# Patient Record
Sex: Female | Born: 1990 | Race: Black or African American | Hispanic: No | Smoking: Never smoker
Health system: Southern US, Community
[De-identification: ages and names within clinical notes are randomized; demographics above are authoritative.]

## PROBLEM LIST (undated history)

## (undated) ENCOUNTER — Inpatient Hospital Stay (HOSPITAL_COMMUNITY): Payer: Self-pay

## (undated) DIAGNOSIS — K219 Gastro-esophageal reflux disease without esophagitis: Secondary | ICD-10-CM

## (undated) DIAGNOSIS — Z789 Other specified health status: Secondary | ICD-10-CM

## (undated) HISTORY — PX: OTHER SURGICAL HISTORY: SHX169

---
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
## 2010-04-30 ENCOUNTER — Emergency Department (HOSPITAL_BASED_OUTPATIENT_CLINIC_OR_DEPARTMENT_OTHER): Admission: EM | Admit: 2010-04-30 | Discharge: 2010-04-30 | Payer: Self-pay | Admitting: Emergency Medicine

## 2011-02-19 LAB — URINE CULTURE: Colony Count: 50000

## 2011-02-19 LAB — URINALYSIS, ROUTINE W REFLEX MICROSCOPIC
Bilirubin Urine: NEGATIVE
Hgb urine dipstick: NEGATIVE
Ketones, ur: 40 mg/dL — AB
Protein, ur: NEGATIVE mg/dL
Urobilinogen, UA: 1 mg/dL (ref 0.0–1.0)

## 2011-02-19 LAB — BASIC METABOLIC PANEL
CO2: 25 mEq/L (ref 19–32)
Calcium: 9 mg/dL (ref 8.4–10.5)
Glucose, Bld: 81 mg/dL (ref 70–99)
Sodium: 140 mEq/L (ref 135–145)

## 2011-02-19 LAB — DIFFERENTIAL
Basophils Absolute: 0.1 10*3/uL (ref 0.0–0.1)
Basophils Relative: 0 % (ref 0–1)
Eosinophils Relative: 3 % (ref 0–5)
Monocytes Absolute: 1 10*3/uL (ref 0.1–1.0)
Neutro Abs: 10.2 10*3/uL — ABNORMAL HIGH (ref 1.7–7.7)

## 2011-02-19 LAB — CBC
Hemoglobin: 11.4 g/dL — ABNORMAL LOW (ref 12.0–15.0)
MCHC: 33.7 g/dL (ref 30.0–36.0)
RDW: 12.7 % (ref 11.5–15.5)

## 2011-03-20 LAB — URINALYSIS, ROUTINE W REFLEX MICROSCOPIC
Bilirubin Urine: NEGATIVE
Ketones, ur: NEGATIVE mg/dL
Nitrite: NEGATIVE
Urobilinogen, UA: 0.2 mg/dL (ref 0.0–1.0)

## 2014-07-11 ENCOUNTER — Emergency Department (HOSPITAL_BASED_OUTPATIENT_CLINIC_OR_DEPARTMENT_OTHER)
Admission: EM | Admit: 2014-07-11 | Discharge: 2014-07-11 | Disposition: A | Discharge status: Home / Self Care | Payer: Self-pay | Attending: Emergency Medicine | Admitting: Emergency Medicine

## 2014-07-11 ENCOUNTER — Encounter (HOSPITAL_BASED_OUTPATIENT_CLINIC_OR_DEPARTMENT_OTHER): Payer: Self-pay | Admitting: Emergency Medicine

## 2014-07-11 DIAGNOSIS — R51 Headache: Secondary | ICD-10-CM | POA: Insufficient documentation

## 2014-07-11 DIAGNOSIS — R519 Headache, unspecified: Secondary | ICD-10-CM

## 2014-07-11 MED ORDER — DIPHENHYDRAMINE HCL 50 MG/ML IJ SOLN
25.0000 mg | Freq: Once | INTRAMUSCULAR | Status: AC
  Administered 2014-07-11: 25 mg via INTRAVENOUS
  Filled 2014-07-11: qty 1

## 2014-07-11 MED ORDER — KETOROLAC TROMETHAMINE 30 MG/ML IJ SOLN
30.0000 mg | Freq: Once | INTRAMUSCULAR | Status: AC
  Administered 2014-07-11: 30 mg via INTRAVENOUS
  Filled 2014-07-11: qty 1

## 2014-07-11 MED ORDER — SODIUM CHLORIDE 0.9 % IV BOLUS (SEPSIS)
1000.0000 mL | Freq: Once | INTRAVENOUS | Status: AC
  Administered 2014-07-11: 1000 mL via INTRAVENOUS

## 2014-07-11 MED ORDER — METOCLOPRAMIDE HCL 5 MG/ML IJ SOLN
10.0000 mg | Freq: Once | INTRAMUSCULAR | Status: AC
  Administered 2014-07-11: 10 mg via INTRAVENOUS
  Filled 2014-07-11: qty 2

## 2014-07-11 NOTE — Discharge Instructions (Signed)
You are having a headache. No specific cause was found today for your headache. It may have been a migraine or other cause of headache. Stress, anxiety, fatigue, and depression are common triggers for headaches. Your headache today does not appear to be life-threatening or require hospitalization, but often the exact cause of headaches is not determined in the emergency department. Therefore, follow-up with your doctor is very important to find out what may have caused your headache, and whether or not you need any further diagnostic testing or treatment. Sometimes headaches can appear benign (not harmful), but then more serious symptoms can develop which should prompt an immediate re-evaluation by your doctor or the emergency department. °SEEK MEDICAL ATTENTION IF: °You develop possible problems with medications prescribed.  °The medications don't resolve your headache, if it recurs , or if you have multiple episodes of vomiting or can't take fluids. °You have a change from the usual headache. °RETURN IMMEDIATELY IF you develop a sudden, severe headache or confusion, become poorly responsive or faint, develop a fever above 100.4F or problem breathing, have a change in speech, vision, swallowing, or understanding, or develop new weakness, numbness, tingling, incoordination, or have a seizure. ° ° °General Headache Without Cause °A headache is pain or discomfort felt around the head or neck area. The specific cause of a headache may not be found. There are many causes and types of headaches. A few common ones are: °· Tension headaches. °· Migraine headaches. °· Cluster headaches. °· Chronic daily headaches. °HOME CARE INSTRUCTIONS  °· Keep all follow-up appointments with your caregiver or any specialist referral. °· Only take over-the-counter or prescription medicines for pain or discomfort as directed by your caregiver. °· Lie down in a dark, quiet room when you have a headache. °· Keep a headache journal to find  out what may trigger your migraine headaches. For example, write down: °¨ What you eat and drink. °¨ How much sleep you get. °¨ Any change to your diet or medicines. °· Try massage or other relaxation techniques. °· Put ice packs or heat on the head and neck. Use these 3 to 4 times per day for 15 to 20 minutes each time, or as needed. °· Limit stress. °· Sit up straight, and do not tense your muscles. °· Quit smoking if you smoke. °· Limit alcohol use. °· Decrease the amount of caffeine you drink, or stop drinking caffeine. °· Eat and sleep on a regular schedule. °· Get 7 to 9 hours of sleep, or as recommended by your caregiver. °· Keep lights dim if bright lights bother you and make your headaches worse. °SEEK MEDICAL CARE IF:  °· You have problems with the medicines you were prescribed. °· Your medicines are not working. °· You have a change from the usual headache. °· You have nausea or vomiting. °SEEK IMMEDIATE MEDICAL CARE IF:  °· Your headache becomes severe. °· You have a fever. °· You have a stiff neck. °· You have loss of vision. °· You have muscular weakness or loss of muscle control. °· You start losing your balance or have trouble walking. °· You feel faint or pass out. °· You have severe symptoms that are different from your first symptoms. °MAKE SURE YOU:  °· Understand these instructions. °· Will watch your condition. °· Will get help right away if you are not doing well or get worse. °Document Released: 11/19/2005 Document Revised: 02/11/2012 Document Reviewed: 12/05/2011 °Exit[employer]® Patient Information ©2015 [employer]. This information is not intended   to replace advice given to you by your health care provider. Make sure you discuss any questions you have with your health care provider. ° °

## 2014-07-11 NOTE — ED Notes (Signed)
Patient states that she has had a headache for 4 days that worsens when she stands up. Denies N/V, denies light sensitivity or vision changes.

## 2014-07-11 NOTE — ED Provider Notes (Signed)
CSN: [id number]     Arrival date & time 07/11/14  1624 History   First MD Initiated Contact with Patient 07/11/14 1647     Chief Complaint  Patient presents with  . Headache     (Consider location/radiation/quality/duration/timing/severity/associated sxs/prior Treatment) HPI 23 year old female presents with 4 days of a headache. The headache is in the middle of her forehead. She states it feels like a aching and pressure. It is worse if she sits up and better when she sleeps. Denies photophobia, blurry vision, nausea, vomiting, weakness, or numbness. No neck pain or stiffness. She's not typically get headaches but last time she had a headache like this was several years ago and it was determined to be a sinus headache. At this time she's not having similar symptoms to that episode, occluding no congestion, fever, cough, or ill feeling. Currently rates the pain as a 5/10.  History reviewed. No pertinent past medical history. History reviewed. No pertinent past surgical history. No family history on file. History  Substance Use Topics  . Smoking status: Never Smoker   . Smokeless tobacco: Not on file  . Alcohol Use: No   OB History   Grav Para Term Preterm Abortions TAB SAB Ect Mult Living                 Review of Systems  Constitutional: Negative for fever.  HENT: Negative for congestion.   Eyes: Negative for photophobia and visual disturbance.  Respiratory: Negative for cough.   Gastrointestinal: Negative for nausea and vomiting.  Musculoskeletal: Negative for neck pain and neck stiffness.  Neurological: Positive for headaches. Negative for dizziness, weakness and numbness.  All other systems reviewed and are negative.     Allergies  Review of patient's allergies indicates no known allergies.  Home Medications   Prior to Admission medications   Not on File   BP 127/64  Pulse 68  Temp(Src) 98.3 F (36.8 C) (Oral)  Resp 18  Ht 5\' 2"  (1.575 m)  Wt 245 lb (111.131  kg)  BMI 44.80 kg/m2  SpO2 99%  LMP 06/29/2014 Physical Exam  Nursing note and vitals reviewed. Constitutional: She is oriented to person, place, and time. She appears well-developed and well-nourished.  HENT:  Head: Normocephalic and atraumatic.  Right Ear: External ear normal.  Left Ear: External ear normal.  Nose: Nose normal.  Eyes: EOM are normal. Pupils are equal, round, and reactive to light. Right eye exhibits no discharge. Left eye exhibits no discharge.  Neck: Normal range of motion. Neck supple.  No pain or stiffness with ROM of neck passively  Cardiovascular: Normal rate, regular rhythm and normal heart sounds.   Pulmonary/Chest: Effort normal and breath sounds normal.  Abdominal: She exhibits no distension.  Neurological: She is alert and oriented to person, place, and time.  CN 2-12 grossly intact. 5/5 strength in all 4 extremities. Normal gait. Normal cerebellar testing  Skin: Skin is warm and dry.    ED Course  Procedures (including critical care time) Labs Review Labs Reviewed - No data to display  Imaging Review No results found.   EKG Interpretation None      MDM   Final diagnoses:  Headache, unspecified headache type    23 year old female with a nonspecific frontal headache. Neurologic exam is completely normal, including normal gait. She is afebrile, has normal neck range of motion and no signs of meningismus. I have very low suspicion for subarachnoid hemorrhage, mass, head bleed, stroke, or meningitis. At this time  and wishes a benign headache and her symptoms completely resolved with IV medicines in the ED. We'll recommend discharge and follow with PCP if headache returns or worsens.    Audree CamelScott T Menelik Mcfarren, MD 07/11/14 [phone]

## 2015-01-11 ENCOUNTER — Emergency Department (HOSPITAL_COMMUNITY)
Admission: EM | Admit: 2015-01-11 | Discharge: 2015-01-11 | Disposition: A | Discharge status: Home / Self Care | Payer: Self-pay | Attending: Emergency Medicine | Admitting: Emergency Medicine

## 2015-01-11 ENCOUNTER — Encounter (HOSPITAL_COMMUNITY): Payer: Self-pay | Admitting: Emergency Medicine

## 2015-01-11 DIAGNOSIS — J029 Acute pharyngitis, unspecified: Secondary | ICD-10-CM | POA: Insufficient documentation

## 2015-01-11 DIAGNOSIS — T162XXA Foreign body in left ear, initial encounter: Secondary | ICD-10-CM | POA: Insufficient documentation

## 2015-01-11 DIAGNOSIS — Y998 Other external cause status: Secondary | ICD-10-CM | POA: Insufficient documentation

## 2015-01-11 DIAGNOSIS — X58XXXA Exposure to other specified factors, initial encounter: Secondary | ICD-10-CM | POA: Insufficient documentation

## 2015-01-11 DIAGNOSIS — Y9289 Other specified places as the place of occurrence of the external cause: Secondary | ICD-10-CM | POA: Insufficient documentation

## 2015-01-11 DIAGNOSIS — Y9389 Activity, other specified: Secondary | ICD-10-CM | POA: Insufficient documentation

## 2015-01-11 LAB — RAPID STREP SCREEN (MED CTR MEBANE ONLY): Streptococcus, Group A Screen (Direct): NEGATIVE

## 2015-01-11 MED ORDER — PHENOL 1.4 % MT LIQD
2.0000 | Freq: Once | OROMUCOSAL | Status: AC
  Administered 2015-01-11: 2 via OROMUCOSAL
  Filled 2015-01-11 (×2): qty 177

## 2015-01-11 NOTE — ED Provider Notes (Signed)
CSN: [id number]     Arrival date & time 01/11/15  6387 History  This chart was scribed for Allen Derry, PA-C working with Rolland Porter, MD by Elveria Rising, ED Scribe. This patient was seen in room TR06C/TR06C and the patient's care was started at 9:06 AM.   Chief Complaint  Patient presents with  . Sore Throat   Patient is a 24 y.o. female presenting with pharyngitis. The history is provided by the patient. No language interpreter was used.  Sore Throat This is a new problem. The current episode started yesterday. The problem occurs constantly. The problem has been unchanged. Associated symptoms include a sore throat. Pertinent negatives include no abdominal pain, arthralgias, chest pain, chills, coughing, fever, headaches, myalgias, nausea, neck pain, numbness, rash, vomiting or weakness. The symptoms are aggravated by swallowing. She has tried nothing for the symptoms. The treatment provided no relief.   HPI Comments: Karen Mcgee is a 24 y.o. healthy female who presents to the Emergency Department complaining of worsening sore throat onset last night. Patient reports 10/10 aching pain that is constant and nonradiating, exacebrated with swallowing and breathing. Patient reports treatment with hot tea last night, but denies improvement/relief; instead, she reports worsening upon waking up this morning.  Patient denies recent sick contacts, but she is accompanied by a small child. Patient denies trouble swallowing, drooling, trismus, ear pain/drainage, rhinorrhea, fever, chills, CP, SOB, cough, abdominal pain, n/v, or rashes.   Of note, states she last used qtips one month ago   History reviewed. No pertinent past medical history. History reviewed. No pertinent past surgical history. No family history on file. History  Substance Use Topics  . Smoking status: Never Smoker   . Smokeless tobacco: Not on file  . Alcohol Use: No   OB History    No data available     Review of Systems   Constitutional: Negative for fever and chills.  HENT: Positive for sore throat. Negative for ear discharge, ear pain, facial swelling, rhinorrhea, trouble swallowing and voice change.   Eyes: Negative for discharge.  Respiratory: Negative for cough and shortness of breath.   Cardiovascular: Negative for chest pain.  Gastrointestinal: Negative for nausea, vomiting, abdominal pain and diarrhea.  Musculoskeletal: Negative for myalgias, arthralgias and neck pain.  Skin: Negative for rash.  Neurological: Negative for weakness, numbness and headaches.  10 Systems reviewed and are negative for acute change except as noted in the HPI.   Allergies  Review of patient's allergies indicates no known allergies.  Home Medications   Prior to Admission medications   Not on File   Triage Vitals: BP 138/75 mmHg  Pulse 87  Temp(Src) 98.5 F (36.9 C) (Oral)  Resp 18  SpO2 99% Physical Exam  Constitutional: She is oriented to person, place, and time. Vital signs are normal. She appears well-developed and well-nourished.  Non-toxic appearance. No distress.  Afebrile nontoxic NAD  HENT:  Head: Normocephalic and atraumatic.  Right Ear: Hearing, tympanic membrane, external ear and ear canal normal.  Left Ear: Hearing and tympanic membrane normal. A foreign body (Qtip) is present.  Nose: Nose normal. Right sinus exhibits no maxillary sinus tenderness and no frontal sinus tenderness. Left sinus exhibits no maxillary sinus tenderness and no frontal sinus tenderness.  Mouth/Throat: Uvula is midline and mucous membranes are normal. No trismus in the jaw. No uvula swelling. Posterior oropharyngeal erythema present. No oropharyngeal exudate, posterior oropharyngeal edema or tonsillar abscesses.  L ear canal with Qtip. No erythema or drainage noted  after removal. TMs clear bilaterally. Nose clear.  Oropharynx with mild erythema, no tonsillar exudate or swelling, no PTA, no uvular deviation or swelling.   Eyes:  Conjunctivae and EOM are normal. Right eye exhibits no discharge. Left eye exhibits no discharge.  Neck: Normal range of motion. Neck supple.  Cardiovascular: Normal rate.   Pulmonary/Chest: Effort normal. No respiratory distress.  Abdominal: Normal appearance. She exhibits no distension.  Musculoskeletal: Normal range of motion.  Lymphadenopathy:       Head (right side): No tonsillar adenopathy present.       Head (left side): Tonsillar adenopathy present.    She has cervical adenopathy.  L sided tonsillar and shotty anterior cervical LAD, TTP  Neurological: She is alert and oriented to person, place, and time. She has normal strength. No sensory deficit.  Skin: Skin is warm, dry and intact. No rash noted.  Psychiatric: She has a normal mood and affect. Her behavior is normal.  Nursing note and vitals reviewed.   ED Course  FOREIGN BODY REMOVAL Date/Time: 01/11/2015 9:27 AM Performed by: Marjean Donna, Elazar Argabright STRUPP Authorized by: Ramond Marrow Consent: Verbal consent obtained. Risks and benefits: risks, benefits and alternatives were discussed Consent given by: patient Patient understanding: patient states understanding of the procedure being performed Patient consent: the patient's understanding of the procedure matches consent given Patient identity confirmed: verbally with patient Body area: ear Location details: left ear Patient sedated: no Patient restrained: no Patient cooperative: yes Localization method: ENT speculum and magnification Removal mechanism: curette and alligator forceps Complexity: simple 1 objects recovered. Objects recovered: Qtip Post-procedure assessment: foreign body removed Patient tolerance: Patient tolerated the procedure well with no immediate complications   (including critical care time)  COORDINATION OF CARE: 9:10 AM- Will await Step culture. Plans to treat symptomatically with chloroseptic spray until test has resulted.  Discussed treatment plan with patient at bedside and patient agreed to plan.   Labs Review Labs Reviewed  RAPID STREP SCREEN  CULTURE, GROUP A STREP    Imaging Review No results found.   EKG Interpretation None      MDM   Final diagnoses:  Sore throat  Ear foreign body, left, initial encounter    24 y.o. female with sore throat x1 day. No tonsillar exudates or swelling, mildly erythematous. Mild tender anterior LAD. Will test for strep but CENTOR criteria low therefore if neg, then will not treat and will wait for culture. Given chloraseptic spray here for pain. Pt also found to have Qtip lodged in L ear canal, removed successfully and no signs of otitis externa or otitis media. Could be referred pain from ear causing sore throat. Will reassess after RST results.   10:26 AM RST neg. Could be viral, or could be referred pain from ear.  Will have her f/up with [facility] in 1 week. I explained the diagnosis and have given explicit precautions to return to the ER including for any other new or worsening symptoms. The patient understands and accepts the medical plan as it's been dictated and I have answered their questions. Discharge instructions concerning home care and prescriptions have been given. The patient is STABLE and is discharged to home in good condition.    I personally performed the services described in this documentation, which was scribed in my presence. The recorded information has been reviewed and is accurate.   BP 138/75 mmHg  Pulse 87  Temp(Src) 98.5 F (36.9 C) (Oral)  Resp 18  SpO2 99%  Meds ordered this encounter  Medications  . phenol (CHLORASEPTIC) mouth spray 2 spray    Sig:       Ramond MarrowMercedes Strupp Camprubi-Soms, PA-C 01/11/15 1030  Rolland PorterMark James, MD 01/15/15 2055

## 2015-01-11 NOTE — ED Notes (Signed)
Patient states sore throat that started this morning.  Patient denies other symptoms and states didn't take anything at home for pain.

## 2015-01-11 NOTE — ED Notes (Signed)
Contacted pharmacy regarding delay.

## 2015-01-11 NOTE — Discharge Instructions (Signed)
Continue to stay well-hydrated. Gargle warm salt water and spit it out. Continue to alternate between Tylenol and Ibuprofen for pain or fever. Use chloraseptic spray for relief of sore throat. May consider over-the-counter Benadryl or other antihistamine to decrease secretions and for watery itchy eyes. Followup with your primary care doctor in 5-7 days for recheck of ongoing symptoms. Return to emergency department for emergent changing or worsening of symptoms.   Pharyngitis Pharyngitis is redness, pain, and swelling (inflammation) of your pharynx.  CAUSES  Pharyngitis is usually caused by infection. Most of the time, these infections are from viruses (viral) and are part of a cold. However, sometimes pharyngitis is caused by bacteria (bacterial). Pharyngitis can also be caused by allergies. Viral pharyngitis may be spread from person to person by coughing, sneezing, and personal items or utensils (cups, forks, spoons, toothbrushes). Bacterial pharyngitis may be spread from person to person by more intimate contact, such as kissing.  SIGNS AND SYMPTOMS  Symptoms of pharyngitis include:   Sore throat.   Tiredness (fatigue).   Low-grade fever.   Headache.  Joint pain and muscle aches.  Skin rashes.  Swollen lymph nodes.  Plaque-like film on throat or tonsils (often seen with bacterial pharyngitis). DIAGNOSIS  Your health care provider will ask you questions about your illness and your symptoms. Your medical history, along with a physical exam, is often all that is needed to diagnose pharyngitis. Sometimes, a rapid strep test is done. Other lab tests may also be done, depending on the suspected cause.  TREATMENT  Viral pharyngitis will usually get better in 3-4 days without the use of medicine. Bacterial pharyngitis is treated with medicines that kill germs (antibiotics).  HOME CARE INSTRUCTIONS   Drink enough water and fluids to keep your urine clear or pale yellow.   Only take  over-the-counter or prescription medicines as directed by your health care provider:   If you are prescribed antibiotics, make sure you finish them even if you start to feel better.   Do not take aspirin.   Get lots of rest.   Gargle with 8 oz of salt water ( tsp of salt per 1 qt of water) as often as every 1-2 hours to soothe your throat.   Throat lozenges (if you are not at risk for choking) or sprays may be used to soothe your throat. SEEK MEDICAL CARE IF:   You have large, tender lumps in your neck.  You have a rash.  You cough up green, yellow-brown, or bloody spit. SEEK IMMEDIATE MEDICAL CARE IF:   Your neck becomes stiff.  You drool or are unable to swallow liquids.  You vomit or are unable to keep medicines or liquids down.  You have severe pain that does not go away with the use of recommended medicines.  You have trouble breathing (not caused by a stuffy nose). MAKE SURE YOU:   Understand these instructions.  Will watch your condition.  Will get help right away if you are not doing well or get worse. Document Released: 11/19/2005 Document Revised: 09/09/2013 Document Reviewed: 07/27/2013 [facility] Patient Information 2015 [place], [place]. This information is not intended to replace advice given to you by your health care provider. Make sure you discuss any questions you have with your health care provider.  Ear Foreign Body An ear foreign body is an object that is stuck in the ear. Objects in the ear can cause pain, hearing loss, and buzzing or roaring sounds. They can also cause fluid to  come from the ear. HOME CARE   Keep all doctor visits as told.  Keep small objects away from children. Tell them not to put things in their ears. GET HELP RIGHT AWAY IF:   You have blood coming from your ear.  You have more pain or puffiness (swelling) in the ear.  You have trouble hearing.  You have fluid (discharge) coming from the ear.  You have a  fever.  You get a headache. MAKE SURE YOU:   Understand these instructions.  Will watch your condition.  Will get help right away if you are not doing well or get worse. Document Released: 05/09/2010 Document Revised: 02/11/2012 Document Reviewed: 05/09/2010 [facility] Patient Information 2015 [place], [place]. This information is not intended to replace advice given to you by your health care provider. Make sure you discuss any questions you have with your health care provider.

## 2015-01-11 NOTE — ED Notes (Signed)
Pt comfortable with discharge and follow up instructions. Pt declines wheelchair, escorted to waiting area by this RN. No prescriptions. 

## 2015-01-11 NOTE — ED Notes (Signed)
Called pharmacy regarding delay in Approval of Chloraseptic spray.

## 2015-01-13 LAB — CULTURE, GROUP A STREP

## 2015-12-04 NOTE — L&D Delivery Note (Signed)
25 y.o. [id number] at 3942w0d delivered a viable female infant in cephalic, OA position. No nuchal cord. Right anterior shoulder delivered with ease. 60 sec delayed cord clamping. Cord clamped x2 and cut. Placenta delivered spontaneously intact, with 3VC. Fundus firm on exam with massage and pitocin. Good hemostasis noted.  Laceration: None Suture: N/A Good hemostasis noted.  Mom and baby recovering in LDR.    Apgars:  8/9 Weight:  Pending, skin to skin EBL: 150cc    Jen MowElizabeth Mumaw, DO [facility] 09/22/2016, 5:50 AM

## 2016-02-08 ENCOUNTER — Emergency Department (HOSPITAL_BASED_OUTPATIENT_CLINIC_OR_DEPARTMENT_OTHER): Payer: Medicaid Other

## 2016-02-08 ENCOUNTER — Encounter (HOSPITAL_BASED_OUTPATIENT_CLINIC_OR_DEPARTMENT_OTHER): Payer: Self-pay

## 2016-02-08 ENCOUNTER — Emergency Department (HOSPITAL_BASED_OUTPATIENT_CLINIC_OR_DEPARTMENT_OTHER)
Admission: EM | Admit: 2016-02-08 | Discharge: 2016-02-09 | Disposition: A | Discharge status: Home / Self Care | Payer: Medicaid Other | Attending: Emergency Medicine | Admitting: Emergency Medicine

## 2016-02-08 DIAGNOSIS — O26899 Other specified pregnancy related conditions, unspecified trimester: Secondary | ICD-10-CM

## 2016-02-08 DIAGNOSIS — A5901 Trichomonal vulvovaginitis: Secondary | ICD-10-CM | POA: Insufficient documentation

## 2016-02-08 DIAGNOSIS — E669 Obesity, unspecified: Secondary | ICD-10-CM | POA: Insufficient documentation

## 2016-02-08 DIAGNOSIS — O99211 Obesity complicating pregnancy, first trimester: Secondary | ICD-10-CM | POA: Diagnosis not present

## 2016-02-08 DIAGNOSIS — N72 Inflammatory disease of cervix uteri: Secondary | ICD-10-CM

## 2016-02-08 DIAGNOSIS — O23511 Infections of cervix in pregnancy, first trimester: Secondary | ICD-10-CM | POA: Insufficient documentation

## 2016-02-08 DIAGNOSIS — Z3A01 Less than 8 weeks gestation of pregnancy: Secondary | ICD-10-CM | POA: Diagnosis not present

## 2016-02-08 DIAGNOSIS — O9989 Other specified diseases and conditions complicating pregnancy, childbirth and the puerperium: Secondary | ICD-10-CM | POA: Diagnosis present

## 2016-02-08 DIAGNOSIS — O23591 Infection of other part of genital tract in pregnancy, first trimester: Secondary | ICD-10-CM

## 2016-02-08 DIAGNOSIS — O98611 Protozoal diseases complicating pregnancy, first trimester: Secondary | ICD-10-CM | POA: Insufficient documentation

## 2016-02-08 DIAGNOSIS — R109 Unspecified abdominal pain: Secondary | ICD-10-CM

## 2016-02-08 LAB — RAPID HIV SCREEN (HIV 1/2 AB+AG)
HIV 1/2 ANTIBODIES: NONREACTIVE
HIV-1 P24 Antigen - HIV24: NONREACTIVE

## 2016-02-08 LAB — WET PREP, GENITAL
Sperm: NONE SEEN
YEAST WET PREP: NONE SEEN

## 2016-02-08 LAB — URINALYSIS, ROUTINE W REFLEX MICROSCOPIC
Bilirubin Urine: NEGATIVE
GLUCOSE, UA: NEGATIVE mg/dL
Hgb urine dipstick: NEGATIVE
KETONES UR: NEGATIVE mg/dL
NITRITE: NEGATIVE
PH: 5.5 (ref 5.0–8.0)
Protein, ur: NEGATIVE mg/dL
SPECIFIC GRAVITY, URINE: 1.023 (ref 1.005–1.030)

## 2016-02-08 LAB — URINE MICROSCOPIC-ADD ON: RBC / HPF: NONE SEEN RBC/hpf (ref 0–5)

## 2016-02-08 LAB — HCG, QUANTITATIVE, PREGNANCY: HCG, BETA CHAIN, QUANT, S: 66425 m[IU]/mL — AB (ref <5)

## 2016-02-08 LAB — PREGNANCY, URINE: Preg Test, Ur: POSITIVE — AB

## 2016-02-08 MED ORDER — METRONIDAZOLE 500 MG PO TABS
2000.0000 mg | ORAL_TABLET | Freq: Once | ORAL | Status: AC
  Administered 2016-02-08: 2000 mg via ORAL
  Filled 2016-02-08: qty 4

## 2016-02-08 MED ORDER — AZITHROMYCIN 250 MG PO TABS
1000.0000 mg | ORAL_TABLET | Freq: Once | ORAL | Status: AC
  Administered 2016-02-08: 1000 mg via ORAL
  Filled 2016-02-08: qty 4

## 2016-02-08 MED ORDER — CEFTRIAXONE SODIUM 250 MG IJ SOLR
250.0000 mg | Freq: Once | INTRAMUSCULAR | Status: AC
  Administered 2016-02-08: 250 mg via INTRAMUSCULAR
  Filled 2016-02-08: qty 250

## 2016-02-08 NOTE — ED Notes (Signed)
C/o abd, lower back pain x 1 week-denies n/v/d-+ positive vaginal d/c

## 2016-02-08 NOTE — ED Notes (Signed)
Pt c/o lower abdominal pain and lower back pain for over the last week, also c/o moderate amount of white discharge that is odorous.

## 2016-02-08 NOTE — ED Provider Notes (Signed)
CSN: [id number]     Arrival date & time 02/08/16  2046 History   First MD Initiated Contact with Patient 02/08/16 2147     Chief Complaint  Patient presents with  . Abdominal Pain     (Consider location/radiation/quality/duration/timing/severity/associated sxs/prior Treatment) HPI   25 year old G2 P2 female presenting with complaint of low back pain and abdominal pain. Patient states for the past week she has had recurrent pain to her low back and her low abdomen in which she described as a sharp shooting sensation sometimes worsening with movement. Early today while exercising, her pain became more intense and she decided to come here for further valuation. She also complained of having strong vaginal discharge ongoing for the past 5 days. She endorse odor with the discharge. She denies having any fever, chills, headache, nausea vomiting diarrhea, dysuria, vaginal bleeding, or rash. She is sexually active with her husband only. She denies any prior history of STD. Her last menstrual period was 12/11/2015, and patient states her menstruation irregular. She is a nonsmoker.  History reviewed. No pertinent past medical history. History reviewed. No pertinent past surgical history. No family history on file. Social History  Substance Use Topics  . Smoking status: Never Smoker   . Smokeless tobacco: None  . Alcohol Use: No   OB History    No data available     Review of Systems  All other systems reviewed and are negative.     Allergies  Review of patient's allergies indicates no known allergies.  Home Medications   Prior to Admission medications   Not on File   BP 130/75 mmHg  Pulse 80  Temp(Src) 98.6 F (37 C) (Oral)  Resp 18  Ht  (1.651 m)  Wt 107.502 kg  BMI 39.44 kg/m2  SpO2 100%  LMP 12/11/2015 Physical Exam  Constitutional: She appears well-developed and well-nourished. No distress.  Obese African-American female laying in bed in no acute discomfort and  nontoxic in appearance  HENT:  Head: Atraumatic.  Eyes: Conjunctivae are normal.  Neck: Neck supple.  Cardiovascular: Normal rate and regular rhythm.   Pulmonary/Chest: Effort normal and breath sounds normal.  Abdominal: Soft. There is tenderness (Suprapubic tenderness on palpation without guarding or rebound tenderness. Negative Murphy sign, no pain at McBurney's point.).  Genitourinary:  Chaperone present during exam. No inguinal lymphadenopathy or inguinal hernia noted. Normal external genitalia. Pain with speculum insertion. Moderate amount of yellow discharge noted in vaginal vault. Closed cervical os free of lesion or rash. On bimanual examination, right adnexal tenderness without cervical motion tenderness or mass. Exam difficult due to large body habitus.  Neurological: She is alert.  Skin: No rash noted.  Psychiatric: She has a normal mood and affect.  Nursing note and vitals reviewed.   ED Course  Procedures (including critical care time) Labs Review Labs Reviewed  WET PREP, GENITAL - Abnormal; Notable for the following:    Trich, Wet Prep PRESENT (*)    Clue Cells Wet Prep HPF POC PRESENT (*)    WBC, Wet Prep HPF POC MODERATE (*)    All other components within normal limits  URINALYSIS, ROUTINE W REFLEX MICROSCOPIC (NOT AT [facility]) - Abnormal; Notable for the following:    Leukocytes, UA MODERATE (*)    All other components within normal limits  PREGNANCY, URINE - Abnormal; Notable for the following:    Preg Test, Ur POSITIVE (*)    All other components within normal limits  URINE MICROSCOPIC-ADD ON - Abnormal;  Notable for the following:    Squamous Epithelial / LPF 6-30 (*)    Bacteria, UA FEW (*)    All other components within normal limits  HCG, QUANTITATIVE, PREGNANCY - Abnormal; Notable for the following:    hCG, Beta Chain, Quant, [place] [id number] (*)    All other components within normal limits  URINE CULTURE  RAPID HIV SCREEN (HIV 1/2 AB+AG)  RPR  GC/CHLAMYDIA PROBE  AMP ([place]) NOT AT [facility]    Imaging Review [place] Ob Comp Less 14 Wks  02/09/2016  CLINICAL DATA:  Pregnant patient in first-trimester pregnancy with pelvic pain for 1 week. EXAM: OBSTETRIC <14 WK US AND TRANSVAGINAL OB US TECHNIQUE: Both transabdominal and transvaginal ultrasound examinations were performed for complete evaluation of the gestation as well as the maternal uterus, adnexal regions, and pelvic cul-de-sac. Transvaginal technique was performed to assess early pregnancy. COMPARISON:  None. FINDINGS: Intrauterine gestational sac: Visualized/normal in shape. Yolk sac:  Present. Embryo:  Present. Cardiac Activity: Present. Heart Rate: 153  bpm CRL:  13  mm   7 w   4 d                  US EDC: 09/22/2016 Subchorionic hemorrhage:  None visualized. Maternal uterus/adnexae: Both ovaries are visualized and are normal. There is no pelvic free fluid or adnexal mass. IMPRESSION: Single live intrauterine pregnancy estimated gestational age [redacted] weeks 4 days for estimated date of delivery 09/22/2016. No complication. Electronically Signed   By: Rubye OaksMelanie  Ehinger M.D.   On: 02/09/2016 00:00   [place] Ob Transvaginal  02/09/2016  CLINICAL DATA:  Pregnant patient in first-trimester pregnancy with pelvic pain for 1 week. EXAM: OBSTETRIC <14 WK US AND TRANSVAGINAL OB US TECHNIQUE: Both transabdominal and transvaginal ultrasound examinations were performed for complete evaluation of the gestation as well as the maternal uterus, adnexal regions, and pelvic cul-de-sac. Transvaginal technique was performed to assess early pregnancy. COMPARISON:  None. FINDINGS: Intrauterine gestational sac: Visualized/normal in shape. Yolk sac:  Present. Embryo:  Present. Cardiac Activity: Present. Heart Rate: 153  bpm CRL:  13  mm   7 w   4 d                  US EDC: 09/22/2016 Subchorionic hemorrhage:  None visualized. Maternal uterus/adnexae: Both ovaries are visualized and are normal. There is no pelvic free fluid or adnexal mass.  IMPRESSION: Single live intrauterine pregnancy estimated gestational age [redacted] weeks 4 days for estimated date of delivery 09/22/2016. No complication. Electronically Signed   By: Rubye OaksMelanie  Ehinger M.D.   On: 02/09/2016 00:00   I have personally reviewed and evaluated these images and lab results as part of my medical decision-making.   EKG Interpretation None      MDM   Final diagnoses:  Abdominal pain in pregnancy  Trichomonal vaginitis in pregnancy in first trimester  Cervicitis    BP 118/70 mmHg  Pulse 87  Temp(Src) 98.7 F (37.1 C) (Oral)  Resp 18  Ht 5\' 5"  (1.651 m)  Wt 107.502 kg  BMI 39.44 kg/m2  SpO2 98%  LMP 12/11/2015   10:20 PM Patient here with low abdominal pain along with vaginal discharge. Patient states she is in a monogamous relationship with her husband. Her pregnancy test is positive. Her urine shows evidence of trichomonas which is concerning for STD. She does have right adnexal tenderness on pelvic examination with moderate amount of vaginal discharge. She would need a quantitative hCG, along with a  transvaginal ultrasound to rule out ectopic pregnancy. She will need to be treated for STDs with Rocephin, Zithromax, and metronidazole.  12:05 AM Patient's quantitative hCG is 66,425 which puts her at 6-8 weeks. Transvaginal ultrasound demonstrates a single live intrauterine pregnancy estimated gestational age of [redacted] weeks and 4 days for an estimated date of delivery of 09/22/2016 without any signs of complication.  Urine shows 6-30 WBC with moderate leukocyte.  Patient does not have any dysuria. I suspect this finding is likely related to her STI. Have low suspicion for asymptomatic bacteriuria. Since patient is pregnant, urine culture sent and if significant patient may need an antibiotic to treat for asymptomatic bacteriuria.  Fayrene Helper, PA-C 02/09/16 0015  Richardean Canal, MD 02/09/16 [phone]

## 2016-02-09 NOTE — ED Notes (Signed)
Pt verbalizes understanding of d/c instructions and denies any further needs at this time. 

## 2016-02-09 NOTE — Discharge Instructions (Signed)
You are pregnant, estimated to be 7 weeks and 4 days with an estimated delivery date of 09/22/2016.  Please [facility] up with [facility] for further management of your pregnancy.  Avoid alcohol, drugs, or tobacco while pregnant.  You will also be notified if you are tested positive for any other STDs.  Avoid sexual activities until your symptoms are completely resolved.    Pelvic Inflammatory Disease Pelvic inflammatory disease (PID) is an infection in some or all of the female organs. PID can be in the uterus, ovaries, fallopian tubes, or the surrounding tissues that are inside the lower belly area (pelvis). PID can lead to lasting problems if it is not treated. To check for this disease, your doctor may:  Do a physical exam.  Do blood tests, urine tests, or a pregnancy test.  Look at your vaginal discharge.  Do tests to look inside the pelvis.  Test you for other infections. HOME CARE  Take over-the-counter and prescription medicines only as told by your doctor.  If you were prescribed an antibiotic medicine, take it as told by your doctor. Do not stop taking it even if you start to feel better.  Do not have sex until treatment is done or as told by your doctor.  Tell your sex partner if you have PID. Your partner may need to be treated.  Keep all follow-up visits as told by your doctor. This is important.  Your doctor may test you for infection again 3 months after you are treated. GET HELP IF:  You have more fluid (discharge) coming from your vagina or fluid that is not normal.  Your pain does not improve.  You throw up (vomit).  You have a fever.  You cannot take your medicines.  Your partner has a sexually transmitted disease (STD).  You have pain when you pee (urinate). GET HELP RIGHT AWAY IF:  You have more belly (abdominal) or lower belly pain.  You have chills.  You are not better after 72 hours.   This information is not intended to replace advice given  to you by your health care provider. Make sure you discuss any questions you have with your health care provider.   Document Released: 02/15/2009 Document Revised: 08/10/2015 Document Reviewed: 12/27/2014 Elsevier Interactive Patient Education 2016 [employer]   Trichomoniasis Trichomoniasis is an infection caused by an organism called Trichomonas. The infection can affect both women and men. In women, the outer female genitalia and the vagina are affected. In men, the penis is mainly affected, but the prostate and other reproductive organs can also be involved. Trichomoniasis is a sexually transmitted infection (STI) and is most often passed to another person through sexual contact.  RISK FACTORS  Having unprotected sexual intercourse.  Having sexual intercourse with an infected partner. SIGNS AND SYMPTOMS  Symptoms of trichomoniasis in women include:  Abnormal gray-green frothy vaginal discharge.  Itching and irritation of the vagina.  Itching and irritation of the area outside the vagina. Symptoms of trichomoniasis in men include:   Penile discharge with or without pain.  Pain during urination. This results from inflammation of the urethra. DIAGNOSIS  Trichomoniasis may be found during a Pap test or physical exam. Your health care provider may use one of the following methods to help diagnose this infection:  Testing the pH of the vagina with a test tape.  Using a vaginal swab test that checks for the Trichomonas organism. A test is available that provides results within a few minutes.  Examining a urine sample.  Testing vaginal secretions. Your health care provider may test you for other STIs, including HIV. TREATMENT   You may be given medicine to fight the infection. Women should inform their health care provider if they could be or are pregnant. Some medicines used to treat the infection should not be taken during pregnancy.  Your health care provider may recommend  over-the-counter medicines or creams to decrease itching or irritation.  Your sexual partner will need to be treated if infected.  Your health care provider may test you for infection again 3 months after treatment. HOME CARE INSTRUCTIONS   Take medicines only as directed by your health care provider.  Take over-the-counter medicine for itching or irritation as directed by your health care provider.  Do not have sexual intercourse while you have the infection.  Women should not douche or wear tampons while they have the infection.  Discuss your infection with your partner. Your partner may have gotten the infection from you, or you may have gotten it from your partner.  Have your sex partner get examined and treated if necessary.  Practice safe, informed, and protected sex.  See your health care provider for other STI testing. SEEK MEDICAL CARE IF:   You still have symptoms after you finish your medicine.  You develop abdominal pain.  You have pain when you urinate.  You have bleeding after sexual intercourse.  You develop a rash.  Your medicine makes you sick or makes you throw up (vomit). MAKE SURE YOU:  Understand these instructions.  Will watch your condition.  Will get help right away if you are not doing well or get worse.   This information is not intended to replace advice given to you by your health care provider. Make sure you discuss any questions you have with your health care provider.   Document Released: 05/15/2001 Document Revised: 12/10/2014 Document Reviewed: 08/31/2013 Elsevier Interactive Patient Education [employer]

## 2016-02-10 LAB — URINE CULTURE

## 2016-02-10 LAB — GC/CHLAMYDIA PROBE AMP (~~LOC~~) NOT AT ARMC
Chlamydia: POSITIVE — AB
NEISSERIA GONORRHEA: NEGATIVE

## 2016-02-10 LAB — RPR: RPR: NONREACTIVE

## 2016-02-13 ENCOUNTER — Telehealth (HOSPITAL_BASED_OUTPATIENT_CLINIC_OR_DEPARTMENT_OTHER): Payer: Self-pay | Admitting: Emergency Medicine

## 2016-02-13 NOTE — Telephone Encounter (Signed)
+   Chlamydia, will notify patient

## 2016-02-14 ENCOUNTER — Telehealth (HOSPITAL_BASED_OUTPATIENT_CLINIC_OR_DEPARTMENT_OTHER): Payer: Self-pay | Admitting: Emergency Medicine

## 2016-03-07 ENCOUNTER — Encounter: Payer: Self-pay | Admitting: Family Medicine

## 2016-03-07 ENCOUNTER — Ambulatory Visit (INDEPENDENT_AMBULATORY_CARE_PROVIDER_SITE_OTHER): Payer: Medicaid Other | Admitting: Family Medicine

## 2016-03-07 ENCOUNTER — Other Ambulatory Visit (HOSPITAL_COMMUNITY)
Admission: RE | Admit: 2016-03-07 | Discharge: 2016-03-07 | Disposition: A | Discharge status: Home / Self Care | Payer: Medicaid Other | Source: Ambulatory Visit | Attending: Family Medicine | Admitting: Family Medicine

## 2016-03-07 VITALS — BP 124/72 | HR 84 | Wt 234.0 lb

## 2016-03-07 DIAGNOSIS — Z3480 Encounter for supervision of other normal pregnancy, unspecified trimester: Secondary | ICD-10-CM | POA: Insufficient documentation

## 2016-03-07 DIAGNOSIS — Z01419 Encounter for gynecological examination (general) (routine) without abnormal findings: Secondary | ICD-10-CM | POA: Diagnosis not present

## 2016-03-07 DIAGNOSIS — Z36 Encounter for antenatal screening of mother: Secondary | ICD-10-CM

## 2016-03-07 DIAGNOSIS — N760 Acute vaginitis: Secondary | ICD-10-CM | POA: Insufficient documentation

## 2016-03-07 DIAGNOSIS — Z113 Encounter for screening for infections with a predominantly sexual mode of transmission: Secondary | ICD-10-CM | POA: Diagnosis present

## 2016-03-07 DIAGNOSIS — Z3481 Encounter for supervision of other normal pregnancy, first trimester: Secondary | ICD-10-CM | POA: Diagnosis not present

## 2016-03-07 DIAGNOSIS — Z3490 Encounter for supervision of normal pregnancy, unspecified, unspecified trimester: Secondary | ICD-10-CM

## 2016-03-07 NOTE — Progress Notes (Signed)
   Subjective:    Karen Mcgee is a [id number] being seen today for her first obstetrical visit.  Her obstetrical history is insignificant. Patient does intend to breast feed. Pregnancy history fully reviewed.  Patient reports no complaints.  Filed Vitals:   03/07/16 1533  BP: 124/72  Pulse: 84  Weight: 234 lb (106.142 kg)    HISTORY: OB History  Gravida Para Term Preterm AB SAB TAB Ectopic Multiple Living  3 2 2       2     # Outcome Date GA Lbr Len/2nd Weight Sex Delivery Anes PTL Lv  3 Current           2 Term 08/25/10   6 lb 4 oz (2.835 kg) M Vag-Spont  N Y  1 Term 04/30/09   7 lb 1 oz (3.204 kg) F Vag-Spont  N Y     No past medical history on file. Past Surgical History  Procedure Laterality Date  . Pilonidal cystectomy     No family history on file.   Exam    Uterus:     Pelvic Exam:    Perineum: No Hemorrhoids, Normal Perineum   Vulva: normal, Bartholin's, Urethra, Skene's normal   Vagina:  normal mucosa   pH:    Cervix: multiparous appearance   Adnexa: normal adnexa and no mass, fullness, tenderness   Bony Pelvis: gynecoid  System: Breast:     Skin: normal coloration and turgor, no rashes    Neurologic: oriented, normal   Extremities: normal strength, tone, and muscle mass   HEENT PERRLA and extra ocular movement intact   Mouth/Teeth mucous membranes moist, pharynx normal without lesions   Neck supple and no masses   Cardiovascular: regular rate and rhythm, no murmurs or gallops   Respiratory:  appears well, vitals normal, no respiratory distress, acyanotic, normal RR, ear and throat exam is normal, neck free of mass or lymphadenopathy, chest clear, no wheezing, crepitations, rhonchi, normal symmetric air entry   Abdomen: soft, non-tender; bowel sounds normal; no masses,  no organomegaly   Urinary: urethral meatus normal      Assessment:    Pregnancy: [id number] Patient Active Problem List   Diagnosis Date Noted  . Supervision of other normal  pregnancy, antepartum 03/07/2016        Plan:     Initial labs drawn. Prenatal vitamins. Problem list reviewed and updated. Genetic Screening discussed First Screen and Quad Screen: undecided.  Ultrasound discussed; fetal survey: requested.  Follow up in 4 weeks. 50% of 45 min visit spent on counseling and coordination of care.     Candelaria CelesteSTINSON, JACOB JEHIEL 03/07/2016

## 2016-03-07 NOTE — Progress Notes (Signed)
Bedside ultrasound CRL 5.64 cm 12 weeks 1 day - consistent with LMP. FHR 170 BPM. Patient given NOB packet and information on waterbirth.  Armandina StammerJennifer Howard RN BSN

## 2016-03-07 NOTE — Patient Instructions (Signed)
First Trimester of Pregnancy The first trimester of pregnancy is from week 1 until the end of week 12 (months 1 through 3). A week after a sperm fertilizes an egg, the egg will implant on the wall of the uterus. This embryo will begin to develop into a baby. Genes from you and your partner are forming the baby. The female genes determine whether the baby is a boy or a girl. At 6-8 weeks, the eyes and face are formed, and the heartbeat can be seen on ultrasound. At the end of 12 weeks, all the baby's organs are formed.  Now that you are pregnant, you will want to do everything you can to have a healthy baby. Two of the most important things are to get good prenatal care and to follow your health care provider's instructions. Prenatal care is all the medical care you receive before the baby's birth. This care will help prevent, find, and treat any problems during the pregnancy and childbirth. BODY CHANGES Your body goes through many changes during pregnancy. The changes vary from woman to woman.   You may gain or lose a couple of pounds at first.  You may feel sick to your stomach (nauseous) and throw up (vomit). If the vomiting is uncontrollable, call your health care provider.  You may tire easily.  You may develop headaches that can be relieved by medicines approved by your health care provider.  You may urinate more often. Painful urination may mean you have a bladder infection.  You may develop heartburn as a result of your pregnancy.  You may develop constipation because certain hormones are causing the muscles that push waste through your intestines to slow down.  You may develop hemorrhoids or swollen, bulging veins (varicose veins).  Your breasts may begin to grow larger and become tender. Your nipples may stick out more, and the tissue that surrounds them (areola) may become darker.  Your gums may bleed and may be sensitive to brushing and flossing.  Dark spots or blotches (chloasma,  mask of pregnancy) may develop on your face. This will likely fade after the baby is born.  Your menstrual periods will stop.  You may have a loss of appetite.  You may develop cravings for certain kinds of food.  You may have changes in your emotions from day to day, such as being excited to be pregnant or being concerned that something may go wrong with the pregnancy and baby.  You may have more vivid and strange dreams.  You may have changes in your hair. These can include thickening of your hair, rapid growth, and changes in texture. Some women also have hair loss during or after pregnancy, or hair that feels dry or thin. Your hair will most likely return to normal after your baby is born. WHAT TO EXPECT AT YOUR PRENATAL VISITS During a routine prenatal visit:  You will be weighed to make sure you and the baby are growing normally.  Your blood pressure will be taken.  Your abdomen will be measured to track your baby's growth.  The fetal heartbeat will be listened to starting around week 10 or 12 of your pregnancy.  Test results from any previous visits will be discussed. Your health care provider may ask you:  How you are feeling.  If you are feeling the baby move.  If you have had any abnormal symptoms, such as leaking fluid, bleeding, severe headaches, or abdominal cramping.  If you are using any tobacco products,   including cigarettes, chewing tobacco, and electronic cigarettes.  If you have any questions. Other tests that may be performed during your first trimester include:  Blood tests to find your blood type and to check for the presence of any previous infections. They will also be used to check for low iron levels (anemia) and Rh antibodies. Later in the pregnancy, blood tests for diabetes will be done along with other tests if problems develop.  Urine tests to check for infections, diabetes, or protein in the urine.  An ultrasound to confirm the proper growth  and development of the baby.  An amniocentesis to check for possible genetic problems.  Fetal screens for spina bifida and Down syndrome.  You may need other tests to make sure you and the baby are doing well.  HIV (human immunodeficiency virus) testing. Routine prenatal testing includes screening for HIV, unless you choose not to have this test. HOME CARE INSTRUCTIONS  Medicines  Follow your health care provider's instructions regarding medicine use. Specific medicines may be either safe or unsafe to take during pregnancy.  Take your prenatal vitamins as directed.  If you develop constipation, try taking a stool softener if your health care provider approves. Diet  Eat regular, well-balanced meals. Choose a variety of foods, such as meat or vegetable-based protein, fish, milk and low-fat dairy products, vegetables, fruits, and whole grain breads and cereals. Your health care provider will help you determine the amount of weight gain that is right for you.  Avoid raw meat and uncooked cheese. These carry germs that can cause birth defects in the baby.  Eating four or five small meals rather than three large meals a day may help relieve nausea and vomiting. If you start to feel nauseous, eating a few soda crackers can be helpful. Drinking liquids between meals instead of during meals also seems to help nausea and vomiting.  If you develop constipation, eat more high-fiber foods, such as fresh vegetables or fruit and whole grains. Drink enough fluids to keep your urine clear or pale yellow. Activity and Exercise  Exercise only as directed by your health care provider. Exercising will help you:  Control your weight.  Stay in shape.  Be prepared for labor and delivery.  Experiencing pain or cramping in the lower abdomen or low back is a good sign that you should stop exercising. Check with your health care provider before continuing normal exercises.  Try to avoid standing for long  periods of time. Move your legs often if you must stand in one place for a long time.  Avoid heavy lifting.  Wear low-heeled shoes, and practice good posture.  You may continue to have sex unless your health care provider directs you otherwise. Relief of Pain or Discomfort  Wear a good support bra for breast tenderness.   Take warm sitz baths to soothe any pain or discomfort caused by hemorrhoids. Use hemorrhoid cream if your health care provider approves.   Rest with your legs elevated if you have leg cramps or low back pain.  If you develop varicose veins in your legs, wear support hose. Elevate your feet for 15 minutes, 3-4 times a day. Limit salt in your diet. Prenatal Care  Schedule your prenatal visits by the twelfth week of pregnancy. They are usually scheduled monthly at first, then more often in the last 2 months before delivery.  Write down your questions. Take them to your prenatal visits.  Keep all your prenatal visits as directed by your   health care provider. Safety  Wear your seat belt at all times when driving.  Make a list of emergency phone numbers, including numbers for family, friends, the hospital, and police and fire departments. General Tips  Ask your health care provider for a referral to a local prenatal education class. Begin classes no later than at the beginning of month 6 of your pregnancy.  Ask for help if you have counseling or nutritional needs during pregnancy. Your health care provider can offer advice or refer you to specialists for help with various needs.  Do not use hot tubs, steam rooms, or saunas.  Do not douche or use tampons or scented sanitary pads.  Do not cross your legs for long periods of time.  Avoid cat litter boxes and soil used by cats. These carry germs that can cause birth defects in the baby and possibly loss of the fetus by miscarriage or stillbirth.  Avoid all smoking, herbs, alcohol, and medicines not prescribed by  your health care provider. Chemicals in these affect the formation and growth of the baby.  Do not use any tobacco products, including cigarettes, chewing tobacco, and electronic cigarettes. If you need help quitting, ask your health care provider. You may receive counseling support and other resources to help you quit.  Schedule a dentist appointment. At home, brush your teeth with a soft toothbrush and be gentle when you floss. SEEK MEDICAL CARE IF:   You have dizziness.  You have mild pelvic cramps, pelvic pressure, or nagging pain in the abdominal area.  You have persistent nausea, vomiting, or diarrhea.  You have a bad smelling vaginal discharge.  You have pain with urination.  You notice increased swelling in your face, hands, legs, or ankles. SEEK IMMEDIATE MEDICAL CARE IF:   You have a fever.  You are leaking fluid from your vagina.  You have spotting or bleeding from your vagina.  You have severe abdominal cramping or pain.  You have rapid weight gain or loss.  You vomit blood or material that looks like coffee grounds.  You are exposed to German measles and have never had them.  You are exposed to fifth disease or chickenpox.  You develop a severe headache.  You have shortness of breath.  You have any kind of trauma, such as from a fall or a car accident.   This information is not intended to replace advice given to you by your health care provider. Make sure you discuss any questions you have with your health care provider.   Document Released: 11/13/2001 Document Revised: 12/10/2014 Document Reviewed: 09/29/2013 Elsevier Interactive Patient Education 2016 [employer]  

## 2016-03-08 LAB — HIV ANTIBODY (ROUTINE TESTING W REFLEX): HIV 1&2 Ab, 4th Generation: NONREACTIVE

## 2016-03-09 ENCOUNTER — Encounter: Payer: Self-pay | Admitting: Family Medicine

## 2016-03-09 DIAGNOSIS — O9989 Other specified diseases and conditions complicating pregnancy, childbirth and the puerperium: Secondary | ICD-10-CM

## 2016-03-09 DIAGNOSIS — Z283 Underimmunization status: Secondary | ICD-10-CM | POA: Insufficient documentation

## 2016-03-09 DIAGNOSIS — Z2839 Other underimmunization status: Secondary | ICD-10-CM | POA: Insufficient documentation

## 2016-03-09 LAB — OBSTETRIC PANEL
Antibody Screen: NEGATIVE
BASOS ABS: 0 {cells}/uL (ref 0–200)
Basophils Relative: 0 %
EOS ABS: 218 {cells}/uL (ref 15–500)
EOS PCT: 2 %
HCT: 35.9 % (ref 35.0–45.0)
HEP B S AG: NEGATIVE
Hemoglobin: 12.1 g/dL (ref 11.7–15.5)
Lymphocytes Relative: 25 %
Lymphs Abs: 2725 cells/uL (ref 850–3900)
MCH: 30.3 pg (ref 27.0–33.0)
MCHC: 33.7 g/dL (ref 32.0–36.0)
MCV: 89.8 fL (ref 80.0–100.0)
MONO ABS: 654 {cells}/uL (ref 200–950)
MPV: 11.6 fL (ref 7.5–12.5)
Monocytes Relative: 6 %
NEUTROS PCT: 67 %
Neutro Abs: 7303 cells/uL (ref 1500–7800)
PLATELETS: 238 10*3/uL (ref 140–400)
RBC: 4 MIL/uL (ref 3.80–5.10)
RDW: 13.1 % (ref 11.0–15.0)
RH TYPE: POSITIVE
WBC: 10.9 10*3/uL — AB (ref 3.8–10.8)

## 2016-03-09 LAB — CYTOLOGY - PAP

## 2016-03-11 LAB — CULTURE, URINE COMPREHENSIVE: Colony Count: 25000

## 2016-04-04 ENCOUNTER — Ambulatory Visit (INDEPENDENT_AMBULATORY_CARE_PROVIDER_SITE_OTHER): Payer: Medicaid Other | Admitting: Family Medicine

## 2016-04-04 VITALS — BP 134/58 | HR 78 | Wt 236.0 lb

## 2016-04-04 DIAGNOSIS — R12 Heartburn: Secondary | ICD-10-CM

## 2016-04-04 DIAGNOSIS — O26892 Other specified pregnancy related conditions, second trimester: Secondary | ICD-10-CM

## 2016-04-04 DIAGNOSIS — Z3482 Encounter for supervision of other normal pregnancy, second trimester: Secondary | ICD-10-CM

## 2016-04-04 DIAGNOSIS — B372 Candidiasis of skin and nail: Secondary | ICD-10-CM

## 2016-04-04 MED ORDER — PRENATAL VITAMINS 0.8 MG PO TABS: 1.0000 | ORAL_TABLET | Freq: Every day | ORAL | Status: DC

## 2016-04-04 MED ORDER — NYSTATIN 100000 UNIT/GM EX POWD: Freq: Three times a day (TID) | CUTANEOUS | Status: DC

## 2016-04-04 MED ORDER — RANITIDINE HCL 150 MG PO TABS: 150.0000 mg | ORAL_TABLET | Freq: Two times a day (BID) | ORAL | Status: DC | PRN

## 2016-04-04 NOTE — Progress Notes (Signed)
Subjective:  Karen DuhamelLena Mcgee is a 25 y.o. G3P2002 at [redacted]w[redacted]d being seen today for ongoing prenatal care.  She is currently monitored for the following issues for this low-risk pregnancy and has Supervision of other normal pregnancy, antepartum and Rubella non-immune status, antepartum on her problem list.  Patient reports heartburn and rash under right breast - itching and burning x 4-5 days.  AC at work not working, has a lot of sweating..  Contractions: Not present. Vag. Bleeding: None.   . Denies leaking of fluid.   The following portions of the patient's history were reviewed and updated as appropriate: allergies, current medications, past family history, past medical history, past social history, past surgical history and problem list. Problem list updated.  Objective:   Filed Vitals:   04/04/16 1548  BP: 134/58  Pulse: 78  Weight: 236 lb (107.049 kg)    Fetal Status: Fetal Heart Rate (bpm): 158         General:  Alert, oriented and cooperative. Patient is in no acute distress.  Skin: Skin is warm and dry. Raised erythemic area in the right inframammary fold  Cardiovascular: Normal heart rate noted  Respiratory: Normal respiratory effort, no problems with respiration noted  Abdomen: Soft, gravid, appropriate for gestational age. Pain/Pressure: Absent     Pelvic: Vag. Bleeding: None Vag D/C Character: Thin   Cervical exam deferred        Extremities: Normal range of motion.  Edema: None  Mental Status: Normal mood and affect. Normal behavior. Normal judgment and thought content.   Urinalysis: Urine Protein: Negative Urine Glucose: Negative  Assessment and Plan:  Pregnancy: G3P2002 at [redacted]w[redacted]d  1. Supervision of other normal pregnancy, antepartum, second trimester FHT normal.  US scheduled - US MFM OB COMP + 14 WK; Future  2. Heartburn in pregnancy, second trimester Zantac prescribed to take up to twice a day  3. Candidal intertrigo Apply TID  Preterm labor symptoms and  general obstetric precautions including but not limited to vaginal bleeding, contractions, leaking of fluid and fetal movement were reviewed in detail with the patient. Please refer to After Visit Summary for other counseling recommendations.  No Follow-up on file.   Levie HeritageJacob J Stinson, DO

## 2016-04-20 ENCOUNTER — Encounter (HOSPITAL_COMMUNITY): Payer: Self-pay | Admitting: Family Medicine

## 2016-04-26 ENCOUNTER — Ambulatory Visit (HOSPITAL_COMMUNITY)
Admission: RE | Admit: 2016-04-26 | Discharge: 2016-04-26 | Disposition: A | Discharge status: Home / Self Care | Payer: Medicaid Other | Source: Ambulatory Visit | Attending: Family Medicine | Admitting: Family Medicine

## 2016-04-26 ENCOUNTER — Other Ambulatory Visit: Payer: Self-pay | Admitting: Family Medicine

## 2016-04-26 DIAGNOSIS — Z3A18 18 weeks gestation of pregnancy: Secondary | ICD-10-CM

## 2016-04-26 DIAGNOSIS — Z3482 Encounter for supervision of other normal pregnancy, second trimester: Secondary | ICD-10-CM

## 2016-04-26 DIAGNOSIS — Z3689 Encounter for other specified antenatal screening: Secondary | ICD-10-CM

## 2016-04-26 DIAGNOSIS — Z36 Encounter for antenatal screening of mother: Secondary | ICD-10-CM | POA: Diagnosis present

## 2016-05-02 ENCOUNTER — Ambulatory Visit (INDEPENDENT_AMBULATORY_CARE_PROVIDER_SITE_OTHER): Payer: Medicaid Other | Admitting: Family Medicine

## 2016-05-02 VITALS — BP 101/81 | HR 88 | Wt 238.0 lb

## 2016-05-02 DIAGNOSIS — Z3482 Encounter for supervision of other normal pregnancy, second trimester: Secondary | ICD-10-CM

## 2016-05-02 NOTE — Progress Notes (Signed)
Subjective:  Karen IshiharaLena Covington Mcgee is a 25 y.o. G3P2002 at 8229w4d being seen today for ongoing prenatal care.  She is currently monitored for the following issues for this low-risk pregnancy and has Supervision of other normal pregnancy, antepartum and Rubella non-immune status, antepartum on her problem list.  Patient reports no complaints.  Contractions: Not present. Vag. Bleeding: None.  Movement: Present. Denies leaking of fluid.   The following portions of the patient's history were reviewed and updated as appropriate: allergies, current medications, past family history, past medical history, past social history, past surgical history and problem list. Problem list updated.  Objective:   Filed Vitals:   05/02/16 1542  BP: 101/81  Pulse: 88  Weight: 238 lb (107.956 kg)    Fetal Status: Fetal Heart Rate (bpm): 154   Movement: Present     General:  Alert, oriented and cooperative. Patient is in no acute distress.  Skin: Skin is warm and dry. No rash noted.   Cardiovascular: Normal heart rate noted  Respiratory: Normal respiratory effort, no problems with respiration noted  Abdomen: Soft, gravid, appropriate for gestational age. Pain/Pressure: Absent     Pelvic: Vag. Bleeding: None Vag D/C Character: Thin   Cervical exam deferred        Extremities: Normal range of motion.  Edema: None  Mental Status: Normal mood and affect. Normal behavior. Normal judgment and thought content.   Urinalysis: Urine Protein: Negative Urine Glucose: Negative  Assessment and Plan:  Pregnancy: G3P2002 at 7129w4d  1. Supervision of other normal pregnancy, antepartum, second trimester FHT and FH normal.  Incomplete anatomy scan.  Repeat in 4 weeks.  Preterm labor symptoms and general obstetric precautions including but not limited to vaginal bleeding, contractions, leaking of fluid and fetal movement were reviewed in detail with the patient. Please refer to After Visit Summary for other counseling  recommendations.  No Follow-up on file.   Levie HeritageJacob J Stinson, DO

## 2016-05-30 ENCOUNTER — Ambulatory Visit (HOSPITAL_COMMUNITY)
Admission: RE | Admit: 2016-05-30 | Discharge: 2016-05-30 | Disposition: A | Discharge status: Home / Self Care | Payer: Medicaid Other | Source: Ambulatory Visit | Attending: Family Medicine | Admitting: Family Medicine

## 2016-05-30 DIAGNOSIS — Z3482 Encounter for supervision of other normal pregnancy, second trimester: Secondary | ICD-10-CM | POA: Insufficient documentation

## 2016-05-30 DIAGNOSIS — Z3A23 23 weeks gestation of pregnancy: Secondary | ICD-10-CM | POA: Insufficient documentation

## 2016-05-30 DIAGNOSIS — Z36 Encounter for antenatal screening of mother: Secondary | ICD-10-CM | POA: Diagnosis not present

## 2016-05-31 ENCOUNTER — Encounter: Payer: Medicaid Other | Admitting: Obstetrics & Gynecology

## 2016-06-25 ENCOUNTER — Ambulatory Visit (INDEPENDENT_AMBULATORY_CARE_PROVIDER_SITE_OTHER): Payer: Medicaid Other | Admitting: Family Medicine

## 2016-06-25 ENCOUNTER — Encounter (HOSPITAL_COMMUNITY): Payer: Self-pay | Admitting: Obstetrics and Gynecology

## 2016-06-25 VITALS — BP 119/69 | HR 92 | Wt 236.0 lb

## 2016-06-25 DIAGNOSIS — Z283 Underimmunization status: Secondary | ICD-10-CM

## 2016-06-25 DIAGNOSIS — Z3482 Encounter for supervision of other normal pregnancy, second trimester: Secondary | ICD-10-CM

## 2016-06-25 DIAGNOSIS — O09899 Supervision of other high risk pregnancies, unspecified trimester: Secondary | ICD-10-CM

## 2016-06-25 DIAGNOSIS — Z36 Encounter for antenatal screening of mother: Secondary | ICD-10-CM | POA: Diagnosis not present

## 2016-06-25 DIAGNOSIS — Z23 Encounter for immunization: Secondary | ICD-10-CM

## 2016-06-25 DIAGNOSIS — O9989 Other specified diseases and conditions complicating pregnancy, childbirth and the puerperium: Secondary | ICD-10-CM

## 2016-06-25 LAB — CBC
HCT: 31.8 % — ABNORMAL LOW (ref 35.0–45.0)
Hemoglobin: 10.7 g/dL — ABNORMAL LOW (ref 11.7–15.5)
MCH: 30.1 pg (ref 27.0–33.0)
MCHC: 33.6 g/dL (ref 32.0–36.0)
MCV: 89.6 fL (ref 80.0–100.0)
MPV: 11.5 fL (ref 7.5–12.5)
PLATELETS: 207 10*3/uL (ref 140–400)
RBC: 3.55 MIL/uL — ABNORMAL LOW (ref 3.80–5.10)
RDW: 13.7 % (ref 11.0–15.0)
WBC: 9.3 10*3/uL (ref 3.8–10.8)

## 2016-06-25 MED ORDER — TETANUS-DIPHTH-ACELL PERTUSSIS 5-2.5-18.5 LF-MCG/0.5 IM SUSP
0.5000 mL | Freq: Once | INTRAMUSCULAR | Status: AC
  Administered 2016-06-25: 0.5 mL via INTRAMUSCULAR

## 2016-06-25 NOTE — Progress Notes (Signed)
Subjective:  Karen Mcgee is a 25 y.o. G3P2002 at [redacted]w[redacted]d being seen today for ongoing prenatal care.  She is currently monitored for the following issues for this low-risk pregnancy and has Supervision of other normal pregnancy, antepartum and Rubella non-immune status, antepartum on her problem list.  Patient reports no complaints.  Contractions: Not present. Vag. Bleeding: None.  Movement: Present. Denies leaking of fluid.   The following portions of the patient's history were reviewed and updated as appropriate: allergies, current medications, past family history, past medical history, past social history, past surgical history and problem list. Problem list updated.  Objective:   Vitals:   06/25/16 0957  BP: 119/69  Pulse: 92  Weight: 236 lb (107 kg)    Fetal Status: Fetal Heart Rate (bpm): 135   Movement: Present     General:  Alert, oriented and cooperative. Patient is in no acute distress.  Skin: Skin is warm and dry. No rash noted.   Cardiovascular: Normal heart rate noted  Respiratory: Normal respiratory effort, no problems with respiration noted  Abdomen: Soft, gravid, appropriate for gestational age. Pain/Pressure: Absent     Pelvic:  Cervical exam deferred        Extremities: Normal range of motion.  Edema: None  Mental Status: Normal mood and affect. Normal behavior. Normal judgment and thought content.   Urinalysis: Urine Protein: Negative Urine Glucose: Trace  Assessment and Plan:  Pregnancy: G3P2002 at [redacted]w[redacted]d  1. Supervision of other normal pregnancy, antepartum, second trimester FHT and FH normal.  Pt looking at waterbirth.  28 week labs today  2. Rubella non-immune status, antepartum  Preterm labor symptoms and general obstetric precautions including but not limited to vaginal bleeding, contractions, leaking of fluid and fetal movement were reviewed in detail with the patient. Please refer to After Visit Summary for other counseling recommendations.  No  Follow-up on file.   Levie Heritage, DO

## 2016-06-25 NOTE — Progress Notes (Signed)
Patient doing one hour gtt today. Tubal papers signed. Armandina Stammer RN BSN

## 2016-06-26 LAB — RPR

## 2016-06-26 LAB — GLUCOSE TOLERANCE, 1 HOUR (50G) W/O FASTING: GLUCOSE, 1 HR, GESTATIONAL: 114 mg/dL (ref <140)

## 2016-06-26 LAB — HIV ANTIBODY (ROUTINE TESTING W REFLEX): HIV: NONREACTIVE

## 2016-07-09 ENCOUNTER — Ambulatory Visit (INDEPENDENT_AMBULATORY_CARE_PROVIDER_SITE_OTHER): Payer: Medicaid Other | Admitting: Obstetrics & Gynecology

## 2016-07-09 VITALS — BP 117/65 | HR 93 | Wt 241.0 lb

## 2016-07-09 DIAGNOSIS — Z283 Underimmunization status: Secondary | ICD-10-CM

## 2016-07-09 DIAGNOSIS — Z3483 Encounter for supervision of other normal pregnancy, third trimester: Secondary | ICD-10-CM

## 2016-07-09 DIAGNOSIS — O09899 Supervision of other high risk pregnancies, unspecified trimester: Secondary | ICD-10-CM

## 2016-07-09 DIAGNOSIS — O9989 Other specified diseases and conditions complicating pregnancy, childbirth and the puerperium: Secondary | ICD-10-CM

## 2016-07-09 DIAGNOSIS — Z3009 Encounter for other general counseling and advice on contraception: Secondary | ICD-10-CM

## 2016-07-09 NOTE — Progress Notes (Signed)
Subjective:  Karen Mcgee is a 25 y.o. G3P2002 at 3w2dbeing seen today for ongoing prenatal care.  She is currently monitored for the following issues for this low-risk pregnancy and has Supervision of other normal pregnancy, antepartum; Rubella non-immune status, antepartum; and Sterilization consult on her problem list.  Patient reports no complaints.  Contractions: Not present. Vag. Bleeding: None.  Movement: Present. Denies leaking of fluid.   The following portions of the patient's history were reviewed and updated as appropriate: allergies, current medications, past family history, past medical history, past social history, past surgical history and problem list. Problem list updated.  Objective:   Vitals:   07/09/16 1026  BP: 117/65  Pulse: 93  Weight: 241 lb (109.3 kg)    Fetal Status: Fetal Heart Rate (bpm): 143   Movement: Present     General:  Alert, oriented and cooperative. Patient is in no acute distress.  Skin: Skin is warm and dry. No rash noted.   Cardiovascular: Normal heart rate noted  Respiratory: Normal respiratory effort, no problems with respiration noted  Abdomen: Soft, gravid, appropriate for gestational age. Pain/Pressure: Absent     Pelvic:  Cervical exam deferred        Extremities: Normal range of motion.  Edema: None  Mental Status: Normal mood and affect. Normal behavior. Normal judgment and thought content.   Urinalysis: Urine Protein: Negative Urine Glucose: Negative  Assessment and Plan:  Pregnancy: G3P2002 at 234w2d1. Supervision of other normal pregnancy, antepartum, third trimester Records UTD  2. Rubella non-immune status, antepartum Needs MMR post del  3. Sterilization consult See note. Pt has a needle phobia. She is worried about the spinal.  We reviewed options for anesthesia. She is ok with Versed PRIOR to a spinal but, would prefer general anesthesia if possible.  She reports that she is absolutely sure that she wants the  sterilization but, she is so scared of needles that she is trying to figure out how she will manage the procedure.  Preterm labor symptoms and general obstetric precautions including but not limited to vaginal bleeding, contractions, leaking of fluid and fetal movement were reviewed in detail with the patient. Please refer to After Visit Summary for other counseling recommendations.  Return in about 2 weeks (around 07/23/2016).   CaLavonia DraftsMD

## 2016-07-09 NOTE — Patient Instructions (Signed)
Tdap Vaccine (Tetanus, Diphtheria and Pertussis): What You Need to Know 1. Why get vaccinated? Tetanus, diphtheria and pertussis are very serious diseases. Tdap vaccine can protect us from these diseases. And, Tdap vaccine given to pregnant women can protect newborn babies against pertussis. TETANUS (Lockjaw) is rare in the [place] today. It causes painful muscle tightening and stiffness, usually all over the body.  It can lead to tightening of muscles in the head and neck so you can't open your mouth, swallow, or sometimes even breathe. Tetanus kills about 1 out of 10 people who are infected even after receiving the best medical care. DIPHTHERIA is also rare in the [place] today. It can cause a thick coating to form in the back of the throat.  It can lead to breathing problems, heart failure, paralysis, and death. PERTUSSIS (Whooping Cough) causes severe coughing spells, which can cause difficulty breathing, vomiting and disturbed sleep.  It can also lead to weight loss, incontinence, and rib fractures. Up to 2 in 100 adolescents and 5 in 100 adults with pertussis are hospitalized or have complications, which could include pneumonia or death. These diseases are caused by bacteria. Diphtheria and pertussis are spread from person to person through secretions from coughing or sneezing. Tetanus enters the body through cuts, scratches, or wounds. Before vaccines, as many as 200,000 cases of diphtheria, 200,000 cases of pertussis, and hundreds of cases of tetanus, were reported in the [place] each year. Since vaccination began, reports of cases for tetanus and diphtheria have dropped by about 99% and for pertussis by about 80%. 2. Tdap vaccine Tdap vaccine can protect adolescents and adults from tetanus, diphtheria, and pertussis. One dose of Tdap is routinely given at age 11 or 12. People who did not get Tdap at that age should get it as soon as possible. Tdap is especially important  for healthcare professionals and anyone having close contact with a baby younger than 12 months. Pregnant women should get a dose of Tdap during every pregnancy, to protect the newborn from pertussis. Infants are most at risk for severe, life-threatening complications from pertussis. Another vaccine, called Td, protects against tetanus and diphtheria, but not pertussis. A Td booster should be given every 10 years. Tdap may be given as one of these boosters if you have never gotten Tdap before. Tdap may also be given after a severe cut or burn to prevent tetanus infection. Your doctor or the person giving you the vaccine can give you more information. Tdap may safely be given at the same time as other vaccines. 3. Some people should not get this vaccine  A person who has ever had a life-threatening allergic reaction after a previous dose of any diphtheria, tetanus or pertussis containing vaccine, OR has a severe allergy to any part of this vaccine, should not get Tdap vaccine. Tell the person giving the vaccine about any severe allergies.  Anyone who had coma or long repeated seizures within 7 days after a childhood dose of DTP or DTaP, or a previous dose of Tdap, should not get Tdap, unless a cause other than the vaccine was found. They can still get Td.  Talk to your doctor if you:  have seizures or another nervous system problem,  had severe pain or swelling after any vaccine containing diphtheria, tetanus or pertussis,  ever had a condition called Guillain-Barr Syndrome (GBS),  aren't feeling well on the day the shot is scheduled. 4. Risks With any medicine, including vaccines, there is   a chance of side effects. These are usually mild and go away on their own. Serious reactions are also possible but are rare. Most people who get Tdap vaccine do not have any problems with it. Mild problems following Tdap (Did not interfere with activities)  Pain where the shot was given (about 3 in 4  adolescents or 2 in 3 adults)  Redness or swelling where the shot was given (about 1 person in 5)  Mild fever of at least 100.4F (up to about 1 in 25 adolescents or 1 in 100 adults)  Headache (about 3 or 4 people in 10)  Tiredness (about 1 person in 3 or 4)  Nausea, vomiting, diarrhea, stomach ache (up to 1 in 4 adolescents or 1 in 10 adults)  Chills, sore joints (about 1 person in 10)  Body aches (about 1 person in 3 or 4)  Rash, swollen glands (uncommon) Moderate problems following Tdap (Interfered with activities, but did not require medical attention)  Pain where the shot was given (up to 1 in 5 or 6)  Redness or swelling where the shot was given (up to about 1 in 16 adolescents or 1 in 12 adults)  Fever over 102F (about 1 in 100 adolescents or 1 in 250 adults)  Headache (about 1 in 7 adolescents or 1 in 10 adults)  Nausea, vomiting, diarrhea, stomach ache (up to 1 or 3 people in 100)  Swelling of the entire arm where the shot was given (up to about 1 in 500). Severe problems following Tdap (Unable to perform usual activities; required medical attention)  Swelling, severe pain, bleeding and redness in the arm where the shot was given (rare). Problems that could happen after any vaccine:  People sometimes faint after a medical procedure, including vaccination. Sitting or lying down for about 15 minutes can help prevent fainting, and injuries caused by a fall. Tell your doctor if you feel dizzy, or have vision changes or ringing in the ears.  Some people get severe pain in the shoulder and have difficulty moving the arm where a shot was given. This happens very rarely.  Any medication can cause a severe allergic reaction. Such reactions from a vaccine are very rare, estimated at fewer than 1 in a million doses, and would happen within a few minutes to a few hours after the vaccination. As with any medicine, there is a very remote chance of a vaccine causing a serious  injury or death. The safety of vaccines is always being monitored. For more information, visit: www.cdc.gov/vaccinesafety/ 5. What if there is a serious problem? What should I look for?  Look for anything that concerns you, such as signs of a severe allergic reaction, very high fever, or unusual behavior.  Signs of a severe allergic reaction can include hives, swelling of the face and throat, difficulty breathing, a fast heartbeat, dizziness, and weakness. These would usually start a few minutes to a few hours after the vaccination. What should I do?  If you think it is a severe allergic reaction or other emergency that can't wait, call 9-1-1 or get the person to the nearest hospital. Otherwise, call your doctor.  Afterward, the reaction should be reported to the Vaccine Adverse Event Reporting System (VAERS). Your doctor might file this report, or you can do it yourself through the VAERS web site at www.vaers.hhs.gov, or by calling 1-800-822-7967. VAERS does not give medical advice.  6. The National Vaccine Injury Compensation Program The National Vaccine Injury Compensation Program (  VICP) is a federal program that was created to compensate people who may have been injured by certain vaccines. Persons who believe they may have been injured by a vaccine can learn about the program and about filing a claim by calling 1-800-338-2382 or visiting the VICP website at www.hrsa.gov/vaccinecompensation. There is a time limit to file a claim for compensation. 7. How can I learn more?  Ask your doctor. He or she can give you the vaccine package insert or suggest other sources of information.  Call your local or state health department.  Contact the Centers for Disease Control and Prevention (CDC):  Call 1-800-232-4636 (1-800-CDC-INFO) or  Visit CDC's website at www.cdc.gov/vaccines CDC Tdap Vaccine VIS (01/26/14)   This information is not intended to replace advice given to you by your health care  provider. Make sure you discuss any questions you have with your health care provider.   Document Released: 05/20/2012 Document Revised: 12/10/2014 Document Reviewed: 03/03/2014 Elsevier Interactive Patient Education 2016 [employer]  

## 2016-07-23 ENCOUNTER — Ambulatory Visit (INDEPENDENT_AMBULATORY_CARE_PROVIDER_SITE_OTHER): Payer: Medicaid Other | Admitting: Obstetrics & Gynecology

## 2016-07-23 ENCOUNTER — Encounter: Payer: Self-pay | Admitting: Obstetrics & Gynecology

## 2016-07-23 VITALS — BP 126/58 | HR 91 | Wt 237.0 lb

## 2016-07-23 DIAGNOSIS — O09899 Supervision of other high risk pregnancies, unspecified trimester: Secondary | ICD-10-CM

## 2016-07-23 DIAGNOSIS — O9989 Other specified diseases and conditions complicating pregnancy, childbirth and the puerperium: Secondary | ICD-10-CM

## 2016-07-23 DIAGNOSIS — Z283 Underimmunization status: Secondary | ICD-10-CM

## 2016-07-23 DIAGNOSIS — Z3483 Encounter for supervision of other normal pregnancy, third trimester: Secondary | ICD-10-CM

## 2016-07-23 DIAGNOSIS — Z3009 Encounter for other general counseling and advice on contraception: Secondary | ICD-10-CM

## 2016-07-23 NOTE — Progress Notes (Signed)
Subjective:  Karen IshiharaLena Covington Mcgee is a 25 y.o. G3P2002 at 3935w2d being seen today for ongoing prenatal care.  She is currently monitored for the following issues for this low-risk pregnancy and has Supervision of other normal pregnancy, antepartum; Rubella non-immune status, antepartum; and Sterilization consult on her problem list.  Patient reports no complaints.  Contractions: Not present. Vag. Bleeding: None.  Movement: Present. Denies leaking of fluid.   The following portions of the patient's history were reviewed and updated as appropriate: allergies, current medications, past family history, past medical history, past social history, past surgical history and problem list. Problem list updated.  Objective:   Vitals:   07/23/16 1010  BP: (!) 126/58  Pulse: 91  Weight: 237 lb (107.5 kg)    Fetal Status:     Movement: Present     General:  Alert, oriented and cooperative. Patient is in no acute distress.  Skin: Skin is warm and dry. No rash noted.   Cardiovascular: Normal heart rate noted  Respiratory: Normal respiratory effort, no problems with respiration noted  Abdomen: Soft, gravid, appropriate for gestational age. Pain/Pressure: Present     Pelvic:  Cervical exam deferred        Extremities: Normal range of motion.  Edema: None  Mental Status: Normal mood and affect. Normal behavior. Normal judgment and thought content.   Urinalysis: Urine Protein: Trace Urine Glucose: Negative  Assessment and Plan:  Pregnancy: G3P2002 at 6735w2d  1. Supervision of other normal pregnancy, antepartum, third trimester Pt desires a water birth. Has completed the class but, needs a consult visit with one of the midwives. [facility] for a midwife consult and ROB  2. Sterilization consult Consent signed  3. Rubella non-immune status, antepartum Needs vac PP  Preterm labor symptoms and general obstetric precautions including but not limited to vaginal bleeding,  contractions, leaking of fluid and fetal movement were reviewed in detail with the patient. Please refer to After Visit Summary for other counseling recommendations.  Return in about 4 weeks (around 08/20/2016).   Willodean Rosenthalarolyn Harraway-Smith, MD

## 2016-07-23 NOTE — Patient Instructions (Signed)

## 2016-07-30 ENCOUNTER — Encounter (HOSPITAL_COMMUNITY): Payer: Self-pay | Admitting: *Deleted

## 2016-07-30 ENCOUNTER — Inpatient Hospital Stay (HOSPITAL_COMMUNITY)
Admission: AD | Admit: 2016-07-30 | Discharge: 2016-07-30 | Disposition: A | Discharge status: Home / Self Care | Payer: Medicaid Other | Source: Ambulatory Visit | Attending: Family Medicine | Admitting: Family Medicine

## 2016-07-30 DIAGNOSIS — O4703 False labor before 37 completed weeks of gestation, third trimester: Secondary | ICD-10-CM | POA: Diagnosis not present

## 2016-07-30 DIAGNOSIS — M545 Low back pain, unspecified: Secondary | ICD-10-CM

## 2016-07-30 DIAGNOSIS — Z3A32 32 weeks gestation of pregnancy: Secondary | ICD-10-CM | POA: Insufficient documentation

## 2016-07-30 DIAGNOSIS — O26893 Other specified pregnancy related conditions, third trimester: Secondary | ICD-10-CM

## 2016-07-30 DIAGNOSIS — Z3009 Encounter for other general counseling and advice on contraception: Secondary | ICD-10-CM

## 2016-07-30 LAB — URINALYSIS, ROUTINE W REFLEX MICROSCOPIC
BILIRUBIN URINE: NEGATIVE
GLUCOSE, UA: NEGATIVE mg/dL
HGB URINE DIPSTICK: NEGATIVE
KETONES UR: NEGATIVE mg/dL
Nitrite: NEGATIVE
PH: 6.5 (ref 5.0–8.0)
PROTEIN: NEGATIVE mg/dL
Specific Gravity, Urine: 1.02 (ref 1.005–1.030)

## 2016-07-30 LAB — URINE MICROSCOPIC-ADD ON

## 2016-07-30 MED ORDER — CYCLOBENZAPRINE HCL 10 MG PO TABS: 10.0000 mg | ORAL_TABLET | Freq: Three times a day (TID) | ORAL | 0 refills | Status: DC | PRN

## 2016-07-30 MED ORDER — NIFEDIPINE 10 MG PO CAPS
10.0000 mg | ORAL_CAPSULE | Freq: Once | ORAL | Status: AC
  Administered 2016-07-30: 10 mg via ORAL
  Filled 2016-07-30: qty 1

## 2016-07-30 MED ORDER — CYCLOBENZAPRINE HCL 5 MG PO TABS
5.0000 mg | ORAL_TABLET | Freq: Once | ORAL | Status: AC
  Administered 2016-07-30: 5 mg via ORAL
  Filled 2016-07-30: qty 1

## 2016-07-30 NOTE — MAU Provider Note (Signed)
Chief Complaint:  Back Pain   First Provider Initiated Contact with Patient 07/30/16 1420     HPI: Karen Mcgee is a 25 y.o. G3P2002 at 71w2dwho presents to maternity admissions reporting low back pain and contractions.  Some of back pain seems related to contractions but it hurts separately also. She reports good fetal movement, denies LOF, vaginal bleeding, vaginal itching/burning, urinary symptoms, h/a, dizziness, n/v, diarrhea, constipation or fever/chills.    Back Pain  This is a new problem. The current episode started today. The problem occurs intermittently. The problem has been waxing and waning since onset. The pain is present in the lumbar spine. The pain does not radiate. The pain is moderate. Stiffness is present all day. Associated symptoms include abdominal pain and pelvic pain. Pertinent negatives include no fever, headaches, leg pain, tingling or weakness. She has tried heat for the symptoms. The treatment provided mild relief.  Abdominal Pain  This is a new problem. The current episode started today. The onset quality is gradual. The problem occurs intermittently. The problem has been waxing and waning. The pain is located in the suprapubic region. The pain is mild. The quality of the pain is cramping. The abdominal pain radiates to the back. Pertinent negatives include no fever or headaches. Nothing aggravates the pain. The pain is relieved by nothing. She has tried nothing for the symptoms.   RN Note: Back pain for 3 days, today is the worst. Was at work, had to leave. States sometimes a heating pad helps  Past Medical History: History reviewed. No pertinent past medical history.  Past obstetric history: OB History  Gravida Para Term Preterm AB Living  3 2 2     2   SAB TAB Ectopic Multiple Live Births          2    # Outcome Date GA Lbr Len/2nd Weight Sex Delivery Anes PTL Lv  3 Current           2 Term 08/25/10   2.835 kg (6 lb 4 oz) M Vag-Spont  N LIV  1  Term 04/30/09   3.204 kg (7 lb 1 oz) F Vag-Spont  N LIV      Past Surgical History: Past Surgical History:  Procedure Laterality Date  . pilonidal cystectomy      Family History: History reviewed. No pertinent family history.  Social History: Social History  Substance Use Topics  . Smoking status: Never Smoker  . Smokeless tobacco: Never Used  . Alcohol use No    Allergies: No Known Allergies  Meds:  Prescriptions Prior to Admission  Medication Sig Dispense Refill Last Dose  . nystatin (NYSTATIN) powder Apply topically 3 (three) times daily. (Patient not taking: Reported on 07/23/2016) 30 g 0 Not Taking  . Prenatal Multivit-Min-Fe-FA (PRENATAL VITAMINS) 0.8 MG tablet Take 1 tablet by mouth daily. 30 tablet 12 Taking  . ranitidine (ZANTAC) 150 MG tablet Take 1 tablet (150 mg total) by mouth 2 (two) times daily as needed for heartburn. (Patient not taking: Reported on 07/23/2016) 90 tablet 3 Not Taking    I have reviewed patient's Past Medical Hx, Surgical Hx, Family Hx, Social Hx, medications and allergies.   ROS:  Review of Systems  Constitutional: Negative for fever.  Gastrointestinal: Positive for abdominal pain.  Genitourinary: Positive for pelvic pain.  Musculoskeletal: Positive for back pain.  Neurological: Negative for tingling, weakness and headaches.   Other systems negative  Physical Exam  Patient Vitals for the past 24 hrs:  BP Temp Temp src Pulse Resp Height Weight  07/30/16 1405 113/74 98.1 F (36.7 C) Oral 99 18 - -  07/30/16 1351 - - - - - 5\' 5"  (1.651 m) 108.9 kg (240 lb)   Constitutional: Well-developed, well-nourished female in no acute distress.  Cardiovascular: normal rate and rhythm Respiratory: normal effort, clear to auscultation bilaterally GI: Abd soft, non-tender, gravid appropriate for gestational age.   No rebound or guarding. MS: Extremities nontender, no edema, normal ROM Neurologic: Alert and oriented x 4.  GU: Neg CVAT.  PELVIC  EXAM:   Dilation: Closed Effacement (%): 40 Station: -3 Exam by:: Artelia LarocheM. Anthonella Klausner CNM  FHT:  Baseline 140 , moderate variability, accelerations present, no decelerations Contractions: q 5 min,  Irregular     Labs: No results found for this or any previous visit (from the past 24 hour(s)). O/POS/-- (04/05 1647)  Imaging:  No results found.  MAU Course/MDM: I have ordered labs and reviewed results.  NST reviewed Procardia given with some reduction in abdominal pain with contractions, but back still hurts some WIll try second Procardia and a half dose of Flexeril Contractions slowed and back pain improved  Assessment: Single IUP at 6163w2d Preterm contractions with some effacement Low back pain  Plan: Discharge home Preterm Labor precautions and fetal kick counts Rx Flexeril for spasm at home Follow up in Office for prenatal visits and recheck of cervix. Has appt this week   Pt stable at time of discharge.  Encouraged to return here or to other Urgent Care/ED if she develops worsening of symptoms, increase in pain, fever, or other concerning symptoms.      Karen Mcgee CNM, MSN Certified Nurse-Midwife 07/30/2016 2:27 PM

## 2016-07-30 NOTE — MAU Note (Addendum)
Back pain for 3 days, today is the worst. Was at work, had to leave. States sometimes a heating pad helps.

## 2016-07-30 NOTE — Discharge Instructions (Signed)
Back Pain in Pregnancy °Back pain during pregnancy is common. It happens in about half of all pregnancies. It is important for you and your baby that you remain active during your pregnancy. If you feel that back pain is not allowing you to remain active or sleep well, it is time to see your caregiver. Back pain may be caused by several factors related to changes during your pregnancy. Fortunately, unless you had trouble with your back before your pregnancy, the pain is likely to get better after you deliver. °Low back pain usually occurs between the fifth and seventh months of pregnancy. It can, however, happen in the first couple months. Factors that increase the risk of back problems include:  °· Previous back problems. °· Injury to your back. °· Having twins or multiple births. °· A chronic cough. °· Stress. °· Job-related repetitive motions. °· Muscle or spinal disease in the back. °· Family history of back problems, ruptured (herniated) discs, or osteoporosis. °· Depression, anxiety, and panic attacks. °CAUSES  °· When you are pregnant, your body produces a hormone called relaxin. This hormone makes the ligaments connecting the low back and pubic bones more flexible. This flexibility allows the baby to be delivered more easily. When your ligaments are loose, your muscles need to work harder to support your back. Soreness in your back can come from tired muscles. Soreness can also come from back tissues that are irritated since they are receiving less support. °· As the baby grows, it puts pressure on the nerves and blood vessels in your pelvis. This can cause back pain. °· As the baby grows and gets heavier during pregnancy, the uterus pushes the stomach muscles forward and changes your center of gravity. This makes your back muscles work harder to maintain good posture. °SYMPTOMS  °Lumbar pain during pregnancy °Lumbar pain during pregnancy usually occurs at or above the waist in the center of the back. There  may be pain and numbness that radiates into your leg or foot. This is similar to low back pain experienced by non-pregnant women. It usually increases with sitting for long periods of time, standing, or repetitive lifting. Tenderness may also be present in the muscles along your upper back. °Posterior pelvic pain during pregnancy °Pain in the back of the pelvis is more common than lumbar pain in pregnancy. It is a deep pain felt in your side at the waistline, or across the tailbone (sacrum), or in both places. You may have pain on one or both sides. This pain can also go into the buttocks and backs of the upper thighs. Pubic and groin pain may also be present. The pain does not quickly resolve with rest, and morning stiffness may also be present. °Pelvic pain during pregnancy can be brought on by most activities. A high level of fitness before and during pregnancy may or may not prevent this problem. Labor pain is usually 1 to 2 minutes apart, lasts for about 1 minute, and involves a bearing down feeling or pressure in your pelvis. However, if you are at term with the pregnancy, constant low back pain can be the beginning of early labor, and you should be aware of this. °DIAGNOSIS  °X-rays of the back should not be done during the first 12 to 14 weeks of the pregnancy and only when absolutely necessary during the rest of the pregnancy. MRIs do not give off radiation and are safe during pregnancy. MRIs also should only be done when absolutely necessary. °HOME CARE INSTRUCTIONS °· Exercise   as directed by your caregiver. Exercise is the most effective way to prevent or manage back pain. If you have a back problem, it is especially important to avoid sports that require sudden body movements. Swimming and walking are great activities. °· Do not stand in one place for long periods of time. °· Do not wear high heels. °· Sit in chairs with good posture. Use a pillow on your lower back if necessary. Make sure your head  rests over your shoulders and is not hanging forward. °· Try sleeping on your side, preferably the left side, with a pillow or two between your legs. If you are sore after a night's rest, your bed may be too soft. Try placing a board between your mattress and box spring. °· Listen to your body when lifting. If you are experiencing pain, ask for help or try bending your knees more so you can use your leg muscles rather than your back muscles. Squat down when picking up something from the floor. Do not bend over. °· Eat a healthy diet. Try to gain weight within your caregiver's recommendations. °· Use heat or cold packs 3 to 4 times a day for 15 minutes to help with the pain. °· Only take over-the-counter or prescription medicines for pain, discomfort, or fever as directed by your caregiver. °Sudden (acute) back pain °· Use bed rest for only the most extreme, acute episodes of back pain. Prolonged bed rest over 48 hours will aggravate your condition. °· Ice is very effective for acute conditions. °¨ Put ice in a plastic bag. °¨ Place a towel between your skin and the bag. °¨ Leave the ice on for 10 to 20 minutes every 2 hours, or as needed. °· Using heat packs for 30 minutes prior to activities is also helpful. °Continued back pain °See your caregiver if you have continued problems. Your caregiver can help or refer you for appropriate physical therapy. With conditioning, most back problems can be avoided. Sometimes, a more serious issue may be the cause of back pain. You should be seen right away if new problems seem to be developing. Your caregiver may recommend: °· A maternity girdle. °· An elastic sling. °· A back brace. °· A massage therapist or acupuncture. °SEEK MEDICAL CARE IF:  °· You are not able to do most of your daily activities, even when taking the pain medicine you were given. °· You need a referral to a physical therapist or chiropractor. °· You want to try acupuncture. °SEEK IMMEDIATE MEDICAL CARE  IF: °· You develop numbness, tingling, weakness, or problems with the use of your arms or legs. °· You develop severe back pain that is no longer relieved with medicines. °· You have a sudden change in bowel or bladder control. °· You have increasing pain in other areas of the body. °· You develop shortness of breath, dizziness, or fainting. °· You develop nausea, vomiting, or sweating. °· You have back pain which is similar to labor pains. °· You have back pain along with your water breaking or vaginal bleeding. °· You have back pain or numbness that travels down your leg. °· Your back pain developed after you fell. °· You develop pain on one side of your back. You may have a kidney stone. °· You see blood in your urine. You may have a bladder infection or kidney stone. °· You have back pain with blisters. You may have shingles. °Back pain is fairly common during pregnancy but should not be accepted as just part of   the process. Back pain should always be treated as soon as possible. This will make your pregnancy as pleasant as possible.   This information is not intended to replace advice given to you by your health care provider. Make sure you discuss any questions you have with your health care provider.   Document Released: 02/27/2006 Document Revised: 02/11/2012 Document Reviewed: 04/10/2011 Elsevier Interactive Patient Education 2016 [employer] Preterm Labor Information Preterm labor is when labor starts at less than 37 weeks of pregnancy. The normal length of a pregnancy is 39 to 41 weeks. CAUSES Often, there is no identifiable underlying cause as to why a woman goes into preterm labor. One of the most common known causes of preterm labor is infection. Infections of the uterus, cervix, vagina, amniotic sac, bladder, kidney, or even the lungs (pneumonia) can cause labor to start. Other suspected causes of preterm labor include:   Urogenital infections, such as yeast infections and bacterial  vaginosis.   Uterine abnormalities (uterine shape, uterine septum, fibroids, or bleeding from the placenta).   A cervix that has been operated on (it may fail to stay closed).   Malformations in the fetus.   Multiple gestations (twins, triplets, and so on).   Breakage of the amniotic sac.  RISK FACTORS  Having a previous history of preterm labor.   Having premature rupture of membranes (PROM).   Having a placenta that covers the opening of the cervix (placenta previa).   Having a placenta that separates from the uterus (placental abruption).   Having a cervix that is too weak to hold the fetus in the uterus (incompetent cervix).   Having too much fluid in the amniotic sac (polyhydramnios).   Taking illegal drugs or smoking while pregnant.   Not gaining enough weight while pregnant.   Being younger than 6618 and older than 25 years old.   Having a low socioeconomic status.   Being African American. SYMPTOMS Signs and symptoms of preterm labor include:   Menstrual-like cramps, abdominal pain, or back pain.  Uterine contractions that are regular, as frequent as six in an hour, regardless of their intensity (may be mild or painful).  Contractions that start on the top of the uterus and spread down to the lower abdomen and back.   A sense of increased pelvic pressure.   A watery or bloody mucus discharge that comes from the vagina.  TREATMENT Depending on the length of the pregnancy and other circumstances, your health care provider may suggest bed rest. If necessary, there are medicines that can be given to stop contractions and to mature the fetal lungs. If labor happens before 34 weeks of pregnancy, a prolonged hospital stay may be recommended. Treatment depends on the condition of both you and the fetus.  WHAT SHOULD YOU DO IF YOU THINK YOU ARE IN PRETERM LABOR? Call your health care provider right away. You will need to go to the hospital to get  checked immediately. HOW CAN YOU PREVENT PRETERM LABOR IN FUTURE PREGNANCIES? You should:   Stop smoking if you smoke.  Maintain healthy weight gain and avoid chemicals and drugs that are not necessary.  Be watchful for any type of infection.  Inform your health care provider if you have a known history of preterm labor.   This information is not intended to replace advice given to you by your health care provider. Make sure you discuss any questions you have with your health care provider.   Document Released: 02/09/2004 Document Revised:  07/22/2013 Document Reviewed: 12/22/2012 Elsevier Interactive Patient Education [employer]2016 Elsevier Inc.

## 2016-08-03 ENCOUNTER — Ambulatory Visit (INDEPENDENT_AMBULATORY_CARE_PROVIDER_SITE_OTHER): Payer: Medicaid Other | Admitting: Advanced Practice Midwife

## 2016-08-03 ENCOUNTER — Encounter: Payer: Self-pay | Admitting: *Deleted

## 2016-08-03 ENCOUNTER — Encounter: Payer: Self-pay | Admitting: Advanced Practice Midwife

## 2016-08-03 DIAGNOSIS — Z3483 Encounter for supervision of other normal pregnancy, third trimester: Secondary | ICD-10-CM

## 2016-08-03 NOTE — Patient Instructions (Addendum)
Considering Waterbirth? Guide for patients at [facility]  Why consider waterbirth?  . Gentle birth for babies . Less pain medicine used in labor . May allow for passive descent/less pushing . May reduce perineal tears  . More mobility and instinctive maternal position changes . Increased maternal relaxation . Reduced blood pressure in labor  Is waterbirth safe? What are the risks of infection, drowning or other complications?  . Infection: o Very low risk (3.7 % for tub vs 4.8% for bed) o 7 in 8000 waterbirths with documented infection o Poorly cleaned equipment most common cause o Slightly lower group B strep transmission rate  . Drowning o Maternal:  - Very low risk   - Related to seizures or fainting o Newborn:  - Very low risk. No evidence of increased risk of respiratory problems in multiple large studies - Physiological protection from breathing under water - Avoid underwater birth if there are any fetal complications - Once baby's head is out of the water, keep it out.  . Birth complication o Some reports of cord trauma, but risk decreased by bringing baby to surface gradually o No evidence of increased risk of shoulder dystocia. Mothers can usually change positions faster in water than in a bed, possibly aiding the maneuvers to free the shoulder.   Am I a candidate for waterbirth?  Yes, if you are: . Full-term (37 weeks or greater)  . Have had an uncomplicated pregnancy and labor  No, if you have: Marland Kitchen Preterm birth less than 37 weeks . Thick, particulate meconium stained fluid . Maternal fever over 101 . Heavy bleeding or signs of placental abruption . Pre-eclampsia  . Any abnormal fetal heart rate pattern . Breech presentation . Twins  . Very large baby . Active communicable infection (this does NOT include group B strep) . Significant limitation to mobility  Please remember that birth is unpredictable. Under certain unforeseeable  circumstances your provider may advise against giving birth in the tub. These decisions will be made on a case-by-case basis and with the safety of you and your baby as our highest priority.  Requirements for patients planning waterbirth  . Ask your midwife if you will be a candidate for waterbirth. . Attend the Barney Drain at [employer] at [phone] or [phone] for dates and times. The class is free and we strongly encourage you to bring your support person. You will receive a certificate of participation to show to your midwife or doctor. . Supplies needed for [facility] and [facility] patients: o Single-use disposable tub liner (birthpoolinabox.com  REGULAR size) o New garden hose labeled "lead-free", "suitable for drinking water", "non-toxic" OR "water potable" o Garden hose to remove the dirty water o Faucet adaptor to attach hose to faucet         o Electric drain pump to remove water (We recommend 792 gallon per hour or greater pump.)  o Fish net o Bathing suit top (optional) o Long-handled mirror (optional)  [employer] sells tubs for $120 if you would rather purchase your own tub   Thinking About Waterbirth???  You must attend a Doren Custard class at [facility]  3rd Wednesday of every month from 7-9pm  [employer] by calling [phone] or online at [employer]  Bring [place] the certificate from the class  Waterbirth supplies needed for [facility] patients:  Our practice has a Heritage manager in a Box tub at the hospital that you can  borrow  You will need to purchase an accessory kit that has all needed supplies through [facility] [phone]) or online $175.00  Or you can purchase the supplies separately: o Single-use disposable tub liner for Birth Pool in a Box (REGULAR size) o New garden hose labeled "lead-free",  "suitable for drinking water", o Electric drain pump to remove water (We recommend 792 gallon per hour or greater pump.)  o  "non-toxic" OR "water potable" o Garden hose to remove the dirty water o Fish net o Bathing suit top (optional) o Long-handled mirror (optional)  [employer] sells tubs for ~ $120 if you would rather purchase your own tub.  They also sell accessories, liners.    Www.waterbirthsolutions.com for tub purchases and supplies  The Labor Ladies (www.thelaborladies.com) $275 for tub rental/set-up & take down/kit   [employer] information regarding doulas (labor support) who provide pool rentals:  IdentityList.se.htm   The Labor Ladies (www.thelaborladies.com)  IdentityList.se.htm   Things that would prevent you from having a waterbirth:  Premature, <37wks  Previous cesarean birth  Presence of thick meconium-stained fluid  Multiple gestation (Twins, triplets, etc.)  Uncontrolled diabetes or gestational diabetes requiring medication  Hypertension  Heavy vaginal bleeding  Non-reassuring fetal heart rate  Active infection (MRSA, etc.)  If your labor has to be induced and induction method requires continuous monitoring of the baby's heart rate  Other risks/issues identified by your obstetrical provider

## 2016-08-03 NOTE — Progress Notes (Signed)
   PRENATAL VISIT NOTE  Subjective:  Karen Mcgee is a 25 y.o. G3P2002 at 274w6d being seen today for ongoing prenatal care.  She is currently monitored for the following issues for this low-risk pregnancy and has Supervision of other normal pregnancy, antepartum; Rubella non-immune status, antepartum; and Sterilization consult on her problem list.  Patient reports no complaints.  Contractions: Not present. Vag. Bleeding: None.  Movement: Present. Denies leaking of fluid.   The following portions of the patient's history were reviewed and updated as appropriate: allergies, current medications, past family history, past medical history, past social history, past surgical history and problem list. Problem list updated.  Objective:   Vitals:   08/03/16 0939  BP: 133/62  Pulse: (!) 102  Weight: 242 lb (109.8 kg)    Fetal Status: Fetal Heart Rate (bpm): 143 Fundal Height: 34 cm Movement: Present  Presentation: Vertex  General:  Alert, oriented and cooperative. Patient is in no acute distress.  Skin: Skin is warm and dry. No rash noted.   Cardiovascular: Normal heart rate noted  Respiratory: Normal respiratory effort, no problems with respiration noted  Abdomen: Soft, gravid, appropriate for gestational age. Pain/Pressure: Present     Pelvic:  Cervical exam deferred        Extremities: Normal range of motion.  Edema: None  Mental Status: Normal mood and affect. Normal behavior. Normal judgment and thought content.   Urinalysis: Urine Protein: 1+ Urine Glucose: Negative  Assessment and Plan:  Pregnancy: G3P2002 at 584w6d  1. Supervision of other normal pregnancy, antepartum, third trimester - Waterbirth and study consents signed.   Preterm labor symptoms and general obstetric precautions including but not limited to vaginal bleeding, contractions, leaking of fluid and fetal movement were reviewed in detail with the patient. Please refer to After Visit Summary for other  counseling recommendations.  Return in about 2 weeks (around 08/17/2016).  Dorathy KinsmanVirginia Roberto Romanoski, CNM

## 2016-08-13 ENCOUNTER — Ambulatory Visit (INDEPENDENT_AMBULATORY_CARE_PROVIDER_SITE_OTHER): Payer: Medicaid Other | Admitting: Obstetrics & Gynecology

## 2016-08-13 DIAGNOSIS — Z3483 Encounter for supervision of other normal pregnancy, third trimester: Secondary | ICD-10-CM

## 2016-08-13 NOTE — Progress Notes (Signed)
   PRENATAL VISIT NOTE  Subjective:  Karen IshiharaLena Covington Mcgee is a 25 y.o. G3P2002 at 3753w2d being seen today for ongoing prenatal care.  She is currently monitored for the following issues for this low-risk pregnancy and has Supervision of other normal pregnancy, antepartum; Rubella non-immune status, antepartum; and Sterilization consult on her problem list.  Patient reports no complaints.  Contractions: Not present. Vag. Bleeding: None.  Movement: Present. Denies leaking of fluid.   The following portions of the patient's history were reviewed and updated as appropriate: allergies, current medications, past family history, past medical history, past social history, past surgical history and problem list. Problem list updated.  Objective:   Vitals:   08/13/16 1023  BP: 125/62  Pulse: (!) 105  Weight: 238 lb (108 kg)    Fetal Status: Fetal Heart Rate (bpm): 144 Fundal Height: 36 cm Movement: Present     General:  Alert, oriented and cooperative. Patient is in no acute distress.  Skin: Skin is warm and dry. No rash noted.   Cardiovascular: Normal heart rate noted  Respiratory: Normal respiratory effort, no problems with respiration noted  Abdomen: Soft, gravid, appropriate for gestational age. Pain/Pressure: Present     Pelvic:  Cervical exam deferred        Extremities: Normal range of motion.  Edema: None  Mental Status: Normal mood and affect. Normal behavior. Normal judgment and thought content.   Urinalysis: Urine Protein: Trace Urine Glucose: Negative  Assessment and Plan:  Pregnancy: G3P2002 at 1653w2d  Supervision of other normal pregnancy, antepartum, third trimester Preterm labor symptoms and general obstetric precautions including but not limited to vaginal bleeding, contractions, leaking of fluid and fetal movement were reviewed in detail with the patient. Please refer to After Visit Summary for other counseling recommendations.  Return in about 2 weeks (around 08/27/2016)  for OB Visit, Pelvic cultures.  Tereso NewcomerUgonna A Anyanwu, MD

## 2016-08-13 NOTE — Patient Instructions (Signed)
Return to clinic for any scheduled appointments or obstetric concerns, or go to [facility] for evaluation  

## 2016-08-29 ENCOUNTER — Ambulatory Visit (INDEPENDENT_AMBULATORY_CARE_PROVIDER_SITE_OTHER): Payer: Medicaid Other | Admitting: Family Medicine

## 2016-08-29 ENCOUNTER — Other Ambulatory Visit (HOSPITAL_COMMUNITY)
Admission: RE | Admit: 2016-08-29 | Discharge: 2016-08-29 | Disposition: A | Discharge status: Home / Self Care | Payer: Medicaid Other | Source: Ambulatory Visit | Attending: Family Medicine | Admitting: Family Medicine

## 2016-08-29 VITALS — BP 127/66 | HR 93 | Wt 246.0 lb

## 2016-08-29 DIAGNOSIS — Z3483 Encounter for supervision of other normal pregnancy, third trimester: Secondary | ICD-10-CM | POA: Diagnosis not present

## 2016-08-29 DIAGNOSIS — Z113 Encounter for screening for infections with a predominantly sexual mode of transmission: Secondary | ICD-10-CM

## 2016-08-29 DIAGNOSIS — Z3493 Encounter for supervision of normal pregnancy, unspecified, third trimester: Secondary | ICD-10-CM

## 2016-08-29 LAB — OB RESULTS CONSOLE GBS: STREP GROUP B AG: NEGATIVE

## 2016-08-29 NOTE — Progress Notes (Signed)
   PRENATAL VISIT NOTE  Subjective:  Karen Mcgee is a 25 y.o. G3P2002 at 430w4d being seen today for ongoing prenatal care.  She is currently monitored for the following issues for this low-risk pregnancy and has Supervision of other normal pregnancy, antepartum; Rubella non-immune status, antepartum; and Sterilization consult on her problem list.  Patient reports no complaints.  Contractions: Not present. Vag. Bleeding: None.  Movement: Present. Denies leaking of fluid.   The following portions of the patient's history were reviewed and updated as appropriate: allergies, current medications, past family history, past medical history, past social history, past surgical history and problem list. Problem list updated.  Objective:   Vitals:   08/29/16 1314  BP: 127/66  Pulse: 93  Weight: 246 lb (111.6 kg)    Fetal Status: Fetal Heart Rate (bpm): 135 Fundal Height: 38 cm Movement: Present     General:  Alert, oriented and cooperative. Patient is in no acute distress.  Skin: Skin is warm and dry. No rash noted.   Cardiovascular: Normal heart rate noted  Respiratory: Normal respiratory effort, no problems with respiration noted  Abdomen: Soft, gravid, appropriate for gestational age. Pain/Pressure: Present     Pelvic:  Cervical exam deferred        Extremities: Normal range of motion.  Edema: None  Mental Status: Normal mood and affect. Normal behavior. Normal judgment and thought content.   Urinalysis:      Assessment and Plan:  Pregnancy: G3P2002 at 6230w4d  1. Supervision of other normal pregnancy, antepartum, third trimester Declined flu  2. Prenatal care, third trimester - Culture, beta strep (group b only) - GC[facility]Chlamydia probe amp ([place]  Preterm labor symptoms and general obstetric precautions including but not limited to vaginal bleeding, contractions, leaking of fluid and fetal movement were reviewed in detail with the patient. Please refer to  After Visit Summary for other counseling recommendations.  Return in about 1 week (around 09/05/2016).  Levie HeritageJacob J Jair Lindblad, DO

## 2016-08-30 LAB — GC/CHLAMYDIA PROBE AMP (~~LOC~~) NOT AT ARMC
CHLAMYDIA, DNA PROBE: NEGATIVE
Neisseria Gonorrhea: NEGATIVE

## 2016-09-01 LAB — CULTURE, BETA STREP (GROUP B ONLY)

## 2016-09-05 ENCOUNTER — Ambulatory Visit (INDEPENDENT_AMBULATORY_CARE_PROVIDER_SITE_OTHER): Payer: Medicaid Other | Admitting: Family Medicine

## 2016-09-05 VITALS — BP 130/58 | HR 105 | Wt 246.0 lb

## 2016-09-05 DIAGNOSIS — Z3493 Encounter for supervision of normal pregnancy, unspecified, third trimester: Secondary | ICD-10-CM

## 2016-09-05 NOTE — Progress Notes (Signed)
   PRENATAL VISIT NOTE  Subjective:  Karen Mcgee is a 25 y.o. G3P2002 at 3224w4d being seen today for ongoing prenatal care.  She is currently monitored for the following issues for this low-risk pregnancy and has Supervision of other normal pregnancy, antepartum; Rubella non-immune status, antepartum; and Sterilization consult on her problem list.  Patient reports no complaints.  Contractions: Not present. Vag. Bleeding: None.  Movement: Present. Denies leaking of fluid.   The following portions of the patient's history were reviewed and updated as appropriate: allergies, current medications, past family history, past medical history, past social history, past surgical history and problem list. Problem list updated.  Objective:   Vitals:   09/05/16 1309  BP: (!) 130/58  Pulse: (!) 105  Weight: 246 lb (111.6 kg)    Fetal Status:     Movement: Present     General:  Alert, oriented and cooperative. Patient is in no acute distress.  Skin: Skin is warm and dry. No rash noted.   Cardiovascular: Normal heart rate noted  Respiratory: Normal respiratory effort, no problems with respiration noted  Abdomen: Soft, gravid, appropriate for gestational age. Pain/Pressure: Present     Pelvic:  Cervical exam deferred        Extremities: Normal range of motion.  Edema: None  Mental Status: Normal mood and affect. Normal behavior. Normal judgment and thought content.   Urinalysis: Urine Protein: Trace Urine Glucose: Negative  Assessment and Plan:  Pregnancy: G3P2002 at 8324w4d  1. Prenatal care, third trimester FHT and FH normal  Term labor symptoms and general obstetric precautions including but not limited to vaginal bleeding, contractions, leaking of fluid and fetal movement were reviewed in detail with the patient. Please refer to After Visit Summary for other counseling recommendations.  Return in about 1 week (around 09/12/2016) for OB f/u.  Levie HeritageJacob J Stinson, DO

## 2016-09-10 ENCOUNTER — Ambulatory Visit (INDEPENDENT_AMBULATORY_CARE_PROVIDER_SITE_OTHER): Payer: Medicaid Other | Admitting: Obstetrics & Gynecology

## 2016-09-10 VITALS — BP 127/63 | HR 102 | Wt 244.0 lb

## 2016-09-10 DIAGNOSIS — Z3483 Encounter for supervision of other normal pregnancy, third trimester: Secondary | ICD-10-CM

## 2016-09-10 DIAGNOSIS — Z3480 Encounter for supervision of other normal pregnancy, unspecified trimester: Secondary | ICD-10-CM

## 2016-09-10 DIAGNOSIS — O09899 Supervision of other high risk pregnancies, unspecified trimester: Secondary | ICD-10-CM

## 2016-09-10 DIAGNOSIS — Z3009 Encounter for other general counseling and advice on contraception: Secondary | ICD-10-CM

## 2016-09-10 DIAGNOSIS — O9989 Other specified diseases and conditions complicating pregnancy, childbirth and the puerperium: Secondary | ICD-10-CM

## 2016-09-10 DIAGNOSIS — Z283 Underimmunization status: Secondary | ICD-10-CM

## 2016-09-10 NOTE — Patient Instructions (Signed)

## 2016-09-10 NOTE — Progress Notes (Signed)
   PRENATAL VISIT NOTE  Subjective:  Karen Mcgee is a 25 y.o. G3P2002 at 9186w2d being seen today for ongoing prenatal care.  She is currently monitored for the following issues for this low-risk pregnancy and has Supervision of other normal pregnancy, antepartum; Rubella non-immune status, antepartum; and Sterilization consult on her problem list.  Patient reports no complaints.  Contractions: Not present. Vag. Bleeding: None.  Movement: Present. Denies leaking of fluid.   The following portions of the patient's history were reviewed and updated as appropriate: allergies, current medications, past family history, past medical history, past social history, past surgical history and problem list. Problem list updated.  Objective:   Vitals:   09/10/16 0831  BP: 127/63  Pulse: (!) 102  Weight: 244 lb (110.7 kg)    Fetal Status: Fetal Heart Rate (bpm): 145   Movement: Present     General:  Alert, oriented and cooperative. Patient is in no acute distress.  Skin: Skin is warm and dry. No rash noted.   Cardiovascular: Normal heart rate noted  Respiratory: Normal respiratory effort, no problems with respiration noted  Abdomen: Soft, gravid, appropriate for gestational age. Pain/Pressure: Present     Pelvic:  Cervical exam performed        Extremities: Normal range of motion.  Edema: None  Mental Status: Normal mood and affect. Normal behavior. Normal judgment and thought content.   Urinalysis: Urine Protein: Trace Urine Glucose: Negative  Assessment and Plan:  Pregnancy: G3P2002 at 6786w2d  1. Supervision of other normal pregnancy, antepartum Pt desires water birth.  Completed the class but, lost the certificate.  Will sned a note to the instructor to get a copy of the certificate.  Pt is frustrated that she is not having ctx.  2. Sterilization consult  3. Rubella non-immune status, antepartum Need vaccine PP  Term labor symptoms and general obstetric precautions including  but not limited to vaginal bleeding, contractions, leaking of fluid and fetal movement were reviewed in detail with the patient. Please refer to After Visit Summary for other counseling recommendations.  Return in about 1 week (around 09/17/2016).  Willodean Rosenthalarolyn Harraway-Smith, MD

## 2016-09-20 ENCOUNTER — Ambulatory Visit (INDEPENDENT_AMBULATORY_CARE_PROVIDER_SITE_OTHER): Payer: Medicaid Other | Admitting: Family Medicine

## 2016-09-20 VITALS — BP 135/66 | HR 90 | Wt 248.0 lb

## 2016-09-20 DIAGNOSIS — Z3480 Encounter for supervision of other normal pregnancy, unspecified trimester: Secondary | ICD-10-CM

## 2016-09-20 DIAGNOSIS — Z3483 Encounter for supervision of other normal pregnancy, third trimester: Secondary | ICD-10-CM

## 2016-09-20 NOTE — Progress Notes (Signed)
   PRENATAL VISIT NOTE  Subjective:  Karen Mcgee is a 25 y.o. G3P2002 at 2626w5d being seen today for ongoing prenatal care.  She is currently monitored for the following issues for this low-risk pregnancy and has Supervision of other normal pregnancy, antepartum; Rubella non-immune status, antepartum; and Sterilization consult on her problem list.  Patient reports no complaints.  Contractions: Irregular. Vag. Bleeding: None.  Movement: Present. Denies leaking of fluid.   The following portions of the patient's history were reviewed and updated as appropriate: allergies, current medications, past family history, past medical history, past social history, past surgical history and problem list. Problem list updated.  Objective:   Vitals:   09/20/16 0847  BP: 135/66  Pulse: 90  Weight: 248 lb (112.5 kg)    Fetal Status: Fetal Heart Rate (bpm): 138 Fundal Height: 45 cm Movement: Present  Presentation: Vertex  General:  Alert, oriented and cooperative. Patient is in no acute distress.  Skin: Skin is warm and dry. No rash noted.   Cardiovascular: Normal heart rate noted  Respiratory: Normal respiratory effort, no problems with respiration noted  Abdomen: Soft, gravid, appropriate for gestational age. Pain/Pressure: Present     Pelvic:  Cervical exam deferred Dilation: 1 Effacement (%): Thick    Extremities: Normal range of motion.  Edema: None  Mental Status: Normal mood and affect. Normal behavior. Normal judgment and thought content.   Assessment and Plan:  Pregnancy: G3P2002 at 6426w5d  1. Supervision of other normal pregnancy, antepartum FHt normal  Term labor symptoms and general obstetric precautions including but not limited to vaginal bleeding, contractions, leaking of fluid and fetal movement were reviewed in detail with the patient. Please refer to After Visit Summary for other counseling recommendations.  No Follow-up on file.  Levie HeritageJacob J Stinson, DO

## 2016-09-21 ENCOUNTER — Inpatient Hospital Stay (HOSPITAL_COMMUNITY)
Admission: AD | Admit: 2016-09-21 | Discharge: 2016-09-23 | DRG: 775 | Disposition: A | Discharge status: Home / Self Care | Payer: Medicaid Other | Source: Ambulatory Visit | Attending: Obstetrics and Gynecology | Admitting: Obstetrics and Gynecology

## 2016-09-21 ENCOUNTER — Telehealth: Payer: Self-pay

## 2016-09-21 ENCOUNTER — Encounter (HOSPITAL_COMMUNITY): Payer: Self-pay | Admitting: *Deleted

## 2016-09-21 DIAGNOSIS — O4202 Full-term premature rupture of membranes, onset of labor within 24 hours of rupture: Secondary | ICD-10-CM | POA: Diagnosis present

## 2016-09-21 DIAGNOSIS — Z3A40 40 weeks gestation of pregnancy: Secondary | ICD-10-CM | POA: Diagnosis not present

## 2016-09-21 DIAGNOSIS — O09899 Supervision of other high risk pregnancies, unspecified trimester: Secondary | ICD-10-CM

## 2016-09-21 DIAGNOSIS — Z3009 Encounter for other general counseling and advice on contraception: Secondary | ICD-10-CM

## 2016-09-21 DIAGNOSIS — Z3A39 39 weeks gestation of pregnancy: Secondary | ICD-10-CM | POA: Diagnosis not present

## 2016-09-21 DIAGNOSIS — O9989 Other specified diseases and conditions complicating pregnancy, childbirth and the puerperium: Secondary | ICD-10-CM

## 2016-09-21 DIAGNOSIS — Z3480 Encounter for supervision of other normal pregnancy, unspecified trimester: Secondary | ICD-10-CM

## 2016-09-21 DIAGNOSIS — Z283 Underimmunization status: Secondary | ICD-10-CM

## 2016-09-21 HISTORY — DX: Other specified health status: Z78.9

## 2016-09-21 LAB — POCT FERN TEST: POCT FERN TEST: POSITIVE

## 2016-09-21 LAB — CBC
HEMATOCRIT: 32.5 % — AB (ref 36.0–46.0)
HEMOGLOBIN: 10.5 g/dL — AB (ref 12.0–15.0)
MCH: 28.3 pg (ref 26.0–34.0)
MCHC: 32.3 g/dL (ref 30.0–36.0)
MCV: 87.6 fL (ref 78.0–100.0)
Platelets: 182 10*3/uL (ref 150–400)
RBC: 3.71 MIL/uL — ABNORMAL LOW (ref 3.87–5.11)
RDW: 13.9 % (ref 11.5–15.5)
WBC: 11.1 10*3/uL — ABNORMAL HIGH (ref 4.0–10.5)

## 2016-09-21 LAB — TYPE AND SCREEN
ABO/RH(D): O POS
Antibody Screen: NEGATIVE

## 2016-09-21 LAB — ABO/RH: ABO/RH(D): O POS

## 2016-09-21 MED ORDER — OXYCODONE-ACETAMINOPHEN 5-325 MG PO TABS: 2.0000 | ORAL_TABLET | ORAL | Status: DC | PRN

## 2016-09-21 MED ORDER — TERBUTALINE SULFATE 1 MG/ML IJ SOLN: 0.2500 mg | Freq: Once | INTRAMUSCULAR | Status: DC | PRN

## 2016-09-21 MED ORDER — OXYTOCIN 40 UNITS IN LACTATED RINGERS INFUSION - SIMPLE MED
2.5000 [IU]/h | INTRAVENOUS | Status: DC
  Filled 2016-09-21: qty 1000

## 2016-09-21 MED ORDER — LACTATED RINGERS IV SOLN: INTRAVENOUS | Status: DC

## 2016-09-21 MED ORDER — OXYTOCIN BOLUS FROM INFUSION: 500.0000 mL | Freq: Once | INTRAVENOUS | Status: DC

## 2016-09-21 MED ORDER — LACTATED RINGERS IV SOLN
500.0000 mL | INTRAVENOUS | Status: DC | PRN
  Administered 2016-09-21: 500 mL via INTRAVENOUS

## 2016-09-21 MED ORDER — SOD CITRATE-CITRIC ACID 500-334 MG/5ML PO SOLN
30.0000 mL | ORAL | Status: DC | PRN
  Administered 2016-09-21: 30 mL via ORAL
  Filled 2016-09-21: qty 15

## 2016-09-21 MED ORDER — LIDOCAINE HCL (PF) 1 % IJ SOLN
30.0000 mL | INTRAMUSCULAR | Status: DC | PRN
  Filled 2016-09-21: qty 30

## 2016-09-21 MED ORDER — ACETAMINOPHEN 325 MG PO TABS: 650.0000 mg | ORAL_TABLET | ORAL | Status: DC | PRN

## 2016-09-21 MED ORDER — ONDANSETRON HCL 4 MG/2ML IJ SOLN: 4.0000 mg | Freq: Four times a day (QID) | INTRAMUSCULAR | Status: DC | PRN

## 2016-09-21 MED ORDER — OXYCODONE-ACETAMINOPHEN 5-325 MG PO TABS: 1.0000 | ORAL_TABLET | ORAL | Status: DC | PRN

## 2016-09-21 MED ORDER — MISOPROSTOL 50MCG HALF TABLET
50.0000 ug | ORAL_TABLET | Freq: Once | ORAL | Status: AC
  Administered 2016-09-21: 50 ug via ORAL
  Filled 2016-09-21: qty 0.5

## 2016-09-21 MED ORDER — MISOPROSTOL 50MCG HALF TABLET: 50.0000 ug | ORAL_TABLET | ORAL | Status: DC

## 2016-09-21 MED ORDER — OXYTOCIN 40 UNITS IN LACTATED RINGERS INFUSION - SIMPLE MED
1.0000 m[IU]/min | INTRAVENOUS | Status: DC
  Administered 2016-09-21: 2 m[IU]/min via INTRAVENOUS

## 2016-09-21 MED ORDER — FLEET ENEMA 7-19 GM/118ML RE ENEM: 1.0000 | ENEMA | RECTAL | Status: DC | PRN

## 2016-09-21 NOTE — MAU Note (Signed)
Patient presents with gushing fluid since 0745 clear to dark yellow in color, denies contractions.

## 2016-09-21 NOTE — Telephone Encounter (Signed)
Patient called stating that she thinks her water broke. Patient states there was some fluid on the bed but then she got up and got a shower and the fluid continues to come out. Patient denies any bleeding. [facility]Patient states understanding. Armandina StammerJennifer Howard RNBSN

## 2016-09-21 NOTE — H&P (Signed)
LABOR AND DELIVERY ADMISSION HISTORY AND PHYSICAL NOTE  Karen Mcgee is a 25 y.o. female [phone] with IUP at 174w6d by ultrasound at [redacted] weeks gestation presenting for SROM at 0745 this morning, noting the fluid to be green-tinged. Patient and family were originally planning on a water birth. At this time the patient would like to undergo a "natural" vaginal delivery. She would like to avoid any medical intervention for this delivery and would instead prefer to increase ambulation and use other non interventions  She reports positive fetal movement. She denies any vaginal bleeding.   Past Medical History: Past Medical History:  Diagnosis Date  . Medical history non-contributory     Past Surgical History: Past Surgical History:  Procedure Laterality Date  . pilonidal cystectomy      Obstetrical History: OB History    Gravida Para Term Preterm AB Living   3 2 2     2    SAB TAB Ectopic Multiple Live Births           2      Social History: Social History   Social History  . Marital status: Married    Spouse name: N/A  . Number of children: N/A  . Years of education: N/A   Social History Main Topics  . Smoking status: Never Smoker  . Smokeless tobacco: Never Used  . Alcohol use No  . Drug use: No  . Sexual activity: Yes    Birth control/ protection: None   Other Topics Concern  . None   Social History Narrative  . None    Family History: History reviewed. No pertinent family history.  Allergies: No Known Allergies  Prescriptions Prior to Admission  Medication Sig Dispense Refill Last Dose  . cyclobenzaprine (FLEXERIL) 10 MG tablet Take 1 tablet (10 mg total) by mouth 3 (three) times daily as needed for muscle spasms. (Patient not taking: Reported on 09/21/2016) 20 tablet 0 Not Taking at Unknown time  . Prenatal Multivit-Min-Fe-FA (PRENATAL VITAMINS) 0.8 MG tablet Take 1 tablet by mouth daily. (Patient not taking: Reported on 09/21/2016) 30 tablet 12 Not  Taking at Unknown time     Review of Systems   All systems reviewed and negative except as stated in HPI  Blood pressure 93/77, pulse 79, temperature 98 F (36.7 C), temperature source Oral, resp. rate 20, height 5\' 6"  (1.676 m), weight 243 lb (110.2 kg), last menstrual period 12/11/2015. General appearance: alert, appears stated age and no distress Lungs: clear to auscultation bilaterally Heart: regular rate and rhythm Abdomen: soft, non-tender; bowel sounds normal Extremities: No calf swelling or tenderness Fetal monitoring: Category 1 Uterine activity: Q 8 min Dilation: 1 Effacement (%): Thick Exam by:: Evelene CroonSantos, md   Prenatal labs: ABO, Rh: --/--/O POS (10/20 1131) Antibody: NEG (10/20 1131) Rubella: !Error! RPR: NON REAC (07/24 1107)  HBsAg: NEGATIVE (04/05 1647)  HIV: NONREACTIVE (07/24 1107)  GBS: Negative (09/27 0000)  1 hr Glucola: 114 Genetic screening:  None Anatomy US: at 7 weeks and 4 days  Prenatal Transfer Tool  Maternal Diabetes: No Genetic Screening: Not done Maternal Ultrasounds/Referrals: Normal Fetal Ultrasounds or other Referrals:  None Maternal Substance Abuse:  No Significant Maternal Medications:  None Significant Maternal Lab Results: None  Results for orders placed or performed during the hospital encounter of 09/21/16 (from the past 24 hour(s))  Fern Test   Collection Time: 09/21/16 10:00 AM  Result Value Ref Range   POCT Fern Test Positive = ruptured amniotic membanes  CBC   Collection Time: 09/21/16 11:31 AM  Result Value Ref Range   WBC 11.1 (H) 4.0 - 10.5 K/uL   RBC 3.71 (L) 3.87 - 5.11 MIL/uL   Hemoglobin 10.5 (L) 12.0 - 15.0 g/dL   HCT 65.7 (L) 84.6 - 96.2 %   MCV 87.6 78.0 - 100.0 fL   MCH 28.3 26.0 - 34.0 pg   MCHC 32.3 30.0 - 36.0 g/dL   RDW 95.2 84.1 - 32.4 %   Platelets 182 150 - 400 K/uL  Type and screen [facility]   Collection Time: 09/21/16 11:31 AM  Result Value Ref Range   ABO/RH(D) O POS     Antibody Screen NEG    Sample Expiration 09/24/2016     Patient Active Problem List   Diagnosis Date Noted  . Meconium in amniotic fluid 09/21/2016  . Sterilization consult 07/09/2016  . Rubella non-immune status, antepartum 03/09/2016  . Supervision of other normal pregnancy, antepartum 03/07/2016    Assessment: Karen Mcgee is a 25 y.o. G3P2002 at [redacted]w[redacted]d here for SROM  #labor: Will allow to proceed, will consider augmentation if progress is inadequate #Pain: No pain control at this time #FWB: Category 1 #ID:  GBS neg #MOF: Breast #MOC:Sterilization  Josue D Santos 09/21/2016, 2:55 PM   OB FELLOW HISTORY AND PHYSICAL ATTESTATION  I have seen and examined this patient; I agree with above documentation in the resident's note. Will allow patient some time to go into labor naturally on own, may then consider intervention with cytotec with foley bulb then pitocin if does not progress on own.   Jen Mow, DO [place] Fellow 09/21/2016

## 2016-09-21 NOTE — Progress Notes (Addendum)
Patient was seen and evaluated at the bedside (14:30). Patient would like to continue "natural" birth without interventions. She plans to begin walking around the unit shortly. Patient is not actively contracting on the monitor but FHT is category 1. Discussed with patient the role of a foley balloon with cervical dilation. Patient refuses at this time. Discussed with patient the need for setting a potential time when augmentation/induction of labor should commence. Counseled patient on elevated risks with ruptured membranes after 24H.

## 2016-09-22 ENCOUNTER — Encounter (HOSPITAL_COMMUNITY)
Admission: AD | Disposition: A | Discharge status: Home / Self Care | Payer: Self-pay | Source: Ambulatory Visit | Attending: Obstetrics and Gynecology

## 2016-09-22 ENCOUNTER — Inpatient Hospital Stay (HOSPITAL_COMMUNITY): Payer: Medicaid Other | Admitting: Anesthesiology

## 2016-09-22 ENCOUNTER — Encounter (HOSPITAL_COMMUNITY): Payer: Self-pay | Admitting: Certified Registered Nurse Anesthetist

## 2016-09-22 ENCOUNTER — Encounter (HOSPITAL_COMMUNITY): Payer: Self-pay | Admitting: *Deleted

## 2016-09-22 DIAGNOSIS — Z3A40 40 weeks gestation of pregnancy: Secondary | ICD-10-CM

## 2016-09-22 DIAGNOSIS — O4202 Full-term premature rupture of membranes, onset of labor within 24 hours of rupture: Secondary | ICD-10-CM

## 2016-09-22 LAB — RPR: RPR Ser Ql: NONREACTIVE

## 2016-09-22 SURGERY — LIGATION, FALLOPIAN TUBE, POSTPARTUM
Anesthesia: Choice | Laterality: Bilateral

## 2016-09-22 MED ORDER — PHENYLEPHRINE 40 MCG/ML (10ML) SYRINGE FOR IV PUSH (FOR BLOOD PRESSURE SUPPORT)
PREFILLED_SYRINGE | INTRAVENOUS | Status: AC
  Filled 2016-09-22: qty 20

## 2016-09-22 MED ORDER — METOCLOPRAMIDE HCL 10 MG PO TABS
10.0000 mg | ORAL_TABLET | Freq: Once | ORAL | Status: AC
  Administered 2016-09-22: 10 mg via ORAL
  Filled 2016-09-22: qty 1

## 2016-09-22 MED ORDER — FENTANYL 2.5 MCG/ML BUPIVACAINE 1/10 % EPIDURAL INFUSION (WH - ANES)
INTRAMUSCULAR | Status: AC
  Filled 2016-09-22: qty 125

## 2016-09-22 MED ORDER — WITCH HAZEL-GLYCERIN EX PADS: 1.0000 "application " | MEDICATED_PAD | CUTANEOUS | Status: DC | PRN

## 2016-09-22 MED ORDER — DIPHENHYDRAMINE HCL 50 MG/ML IJ SOLN: 12.5000 mg | INTRAMUSCULAR | Status: DC | PRN

## 2016-09-22 MED ORDER — ACETAMINOPHEN 325 MG PO TABS: 650.0000 mg | ORAL_TABLET | ORAL | Status: DC | PRN

## 2016-09-22 MED ORDER — FENTANYL CITRATE (PF) 100 MCG/2ML IJ SOLN
100.0000 ug | INTRAMUSCULAR | Status: DC | PRN
  Administered 2016-09-22: 100 ug via INTRAVENOUS
  Filled 2016-09-22: qty 2

## 2016-09-22 MED ORDER — PRENATAL MULTIVITAMIN CH
1.0000 | ORAL_TABLET | Freq: Every day | ORAL | Status: DC
  Administered 2016-09-22 – 2016-09-23 (×2): 1 via ORAL
  Filled 2016-09-22 (×2): qty 1

## 2016-09-22 MED ORDER — BENZOCAINE-MENTHOL 20-0.5 % EX AERO
1.0000 "application " | INHALATION_SPRAY | CUTANEOUS | Status: DC | PRN
  Filled 2016-09-22: qty 56

## 2016-09-22 MED ORDER — PHENYLEPHRINE 40 MCG/ML (10ML) SYRINGE FOR IV PUSH (FOR BLOOD PRESSURE SUPPORT): 80.0000 ug | PREFILLED_SYRINGE | INTRAVENOUS | Status: DC | PRN

## 2016-09-22 MED ORDER — FENTANYL 2.5 MCG/ML BUPIVACAINE 1/10 % EPIDURAL INFUSION (WH - ANES)
14.0000 mL/h | INTRAMUSCULAR | Status: DC | PRN
  Administered 2016-09-22 (×2): 14 mL/h via EPIDURAL

## 2016-09-22 MED ORDER — LACTATED RINGERS IV SOLN: INTRAVENOUS | Status: DC

## 2016-09-22 MED ORDER — SODIUM CHLORIDE 0.9% FLUSH: 3.0000 mL | INTRAVENOUS | Status: DC | PRN

## 2016-09-22 MED ORDER — COCONUT OIL OIL
1.0000 "application " | TOPICAL_OIL | Status: DC | PRN
  Filled 2016-09-22: qty 120

## 2016-09-22 MED ORDER — SIMETHICONE 80 MG PO CHEW: 80.0000 mg | CHEWABLE_TABLET | ORAL | Status: DC | PRN

## 2016-09-22 MED ORDER — TETANUS-DIPHTH-ACELL PERTUSSIS 5-2.5-18.5 LF-MCG/0.5 IM SUSP: 0.5000 mL | Freq: Once | INTRAMUSCULAR | Status: DC

## 2016-09-22 MED ORDER — IBUPROFEN 600 MG PO TABS
600.0000 mg | ORAL_TABLET | Freq: Four times a day (QID) | ORAL | Status: DC
  Administered 2016-09-22 – 2016-09-23 (×6): 600 mg via ORAL
  Filled 2016-09-22 (×6): qty 1

## 2016-09-22 MED ORDER — LACTATED RINGERS IV SOLN
500.0000 mL | Freq: Once | INTRAVENOUS | Status: AC
  Administered 2016-09-22: 500 mL via INTRAVENOUS

## 2016-09-22 MED ORDER — ONDANSETRON HCL 4 MG/2ML IJ SOLN: 4.0000 mg | INTRAMUSCULAR | Status: DC | PRN

## 2016-09-22 MED ORDER — ZOLPIDEM TARTRATE 5 MG PO TABS: 5.0000 mg | ORAL_TABLET | Freq: Every evening | ORAL | Status: DC | PRN

## 2016-09-22 MED ORDER — DIBUCAINE 1 % RE OINT
1.0000 | TOPICAL_OINTMENT | RECTAL | Status: DC | PRN
Start: 2016-09-22 — End: 2016-09-23

## 2016-09-22 MED ORDER — LIDOCAINE HCL (PF) 1 % IJ SOLN
INTRAMUSCULAR | Status: DC | PRN
  Administered 2016-09-22: 10 mL via EPIDURAL

## 2016-09-22 MED ORDER — FAMOTIDINE 20 MG PO TABS
40.0000 mg | ORAL_TABLET | Freq: Once | ORAL | Status: AC
  Administered 2016-09-22: 40 mg via ORAL
  Filled 2016-09-22: qty 2

## 2016-09-22 MED ORDER — OXYTOCIN 40 UNITS IN LACTATED RINGERS INFUSION - SIMPLE MED: 2.5000 [IU]/h | INTRAVENOUS | Status: DC | PRN

## 2016-09-22 MED ORDER — ONDANSETRON HCL 4 MG PO TABS: 4.0000 mg | ORAL_TABLET | ORAL | Status: DC | PRN

## 2016-09-22 MED ORDER — SODIUM CHLORIDE 0.9% FLUSH: 3.0000 mL | Freq: Two times a day (BID) | INTRAVENOUS | Status: DC

## 2016-09-22 MED ORDER — EPHEDRINE 5 MG/ML INJ: 10.0000 mg | INTRAVENOUS | Status: DC | PRN

## 2016-09-22 MED ORDER — DIPHENHYDRAMINE HCL 25 MG PO CAPS: 25.0000 mg | ORAL_CAPSULE | Freq: Four times a day (QID) | ORAL | Status: DC | PRN

## 2016-09-22 MED ORDER — SODIUM CHLORIDE 0.9 % IV SOLN: 250.0000 mL | INTRAVENOUS | Status: DC | PRN

## 2016-09-22 MED ORDER — SENNOSIDES-DOCUSATE SODIUM 8.6-50 MG PO TABS
2.0000 | ORAL_TABLET | ORAL | Status: DC
  Administered 2016-09-23: 2 via ORAL
  Filled 2016-09-22: qty 2

## 2016-09-22 NOTE — Progress Notes (Addendum)
[facility] wipes provided with explanation. Pt and family verbalizes understanding.   1015: MD in room discussing POC for BTL. Pt declines at this time but still unsure if wants to do it later so keeing in epidural cath.   1110: tech in room bathed infant.   1137: breast pump brought to patient.   1400: Lactation at bs.

## 2016-09-22 NOTE — Anesthesia Preprocedure Evaluation (Deleted)
Anesthesia Evaluation  Patient identified by MRN, date of birth, ID band Patient awake    Reviewed: Allergy & Precautions, H&P , NPO status , Patient's Chart, lab work & pertinent test results  History of Anesthesia Complications Negative for: history of anesthetic complications  Airway Mallampati: II  TM Distance: >3 FB Neck ROM: full    Dental no notable dental hx. (+) Teeth Intact   Pulmonary neg pulmonary ROS,    Pulmonary exam normal breath sounds clear to auscultation       Cardiovascular Exercise Tolerance: Good negative cardio ROS Normal cardiovascular exam Rhythm:regular Rate:Normal     Neuro/Psych negative neurological ROS  negative psych ROS   GI/Hepatic negative GI ROS, Neg liver ROS,   Endo/Other  negative endocrine ROSObesity   Renal/GU negative Renal ROS  negative genitourinary   Musculoskeletal negative musculoskeletal ROS (+)   Abdominal   Peds  Hematology negative hematology ROS (+) Blood dyscrasia, anemia ,   Anesthesia Other Findings Day of surgery medications reviewed with the patient.  Reproductive/Obstetrics (+) Pregnancy S/p SVD                             Anesthesia Physical  Anesthesia Plan  ASA: II  Anesthesia Plan: Epidural   Post-op Pain Management:    Induction:   Airway Management Planned:   Additional Equipment:   Intra-op Plan:   Post-operative Plan:   Informed Consent: I have reviewed the patients History and Physical, chart, labs and discussed the procedure including the risks, benefits and alternatives for the proposed anesthesia with the patient or authorized representative who has indicated his/her understanding and acceptance.     Plan Discussed with:   Anesthesia Plan Comments: (Epidural in situ, will attempt to achieve surgical anesthesia by bolusing epidural.  If unsuccessful, will proceed to OR for spinal anesthesia.  All  patient questions answered.  Risks/benefits/alternatives discussed.  Patient wishes to proceed.)        Anesthesia Quick Evaluation

## 2016-09-22 NOTE — Anesthesia Postprocedure Evaluation (Signed)
Anesthesia Post Note  Patient: Karen IshiharaLena Covington Mcgee  Procedure(s) Performed: * No procedures listed *  Patient location during evaluation: Mother Baby Anesthesia Type: Epidural Level of consciousness: awake, awake and alert, oriented and patient cooperative Pain management: pain level controlled Vital Signs Assessment: post-procedure vital signs reviewed and stable Respiratory status: nonlabored ventilation, spontaneous breathing and respiratory function stable Cardiovascular status: stable Postop Assessment: no headache, no backache, no signs of nausea or vomiting and patient able to bend at knees Anesthetic complications: no     Last Vitals:  Vitals:   09/22/16 0631 09/22/16 0655  BP: 127/61 (!) 120/55  Pulse: 92 76  Resp:  18  Temp: 37.2 C 37.3 C    Last Pain:  Vitals:   09/22/16 0655  TempSrc: Axillary  PainSc:    Pain Goal: Patients Stated Pain Goal: 0 (09/22/16 0420)               Ellawyn Wogan L

## 2016-09-22 NOTE — Anesthesia Preprocedure Evaluation (Signed)

## 2016-09-22 NOTE — Anesthesia Procedure Notes (Signed)
Epidural Patient location during procedure: OB  Staffing Anesthesiologist: Delroy Ordway Performed: anesthesiologist   Preanesthetic Checklist Completed: patient identified, site marked, surgical consent, pre-op evaluation, timeout performed, IV checked, risks and benefits discussed and monitors and equipment checked  Epidural Patient position: sitting Prep: DuraPrep Patient monitoring: heart rate, continuous pulse ox and blood pressure Approach: right paramedian Location: L3-L4 Injection technique: LOR saline  Needle:  Needle type: Tuohy  Needle gauge: 17 G Needle length: 9 cm and 9 Needle insertion depth: 6 cm Catheter type: closed end flexible Catheter size: 20 Guage Catheter at skin depth: 10 cm Test dose: negative  Assessment Events: blood not aspirated, injection not painful, no injection resistance, negative IV test and no paresthesia  Additional Notes Patient identified. Risks/Benefits/Options discussed with patient including but not limited to bleeding, infection, nerve damage, paralysis, failed block, incomplete pain control, headache, blood pressure changes, nausea, vomiting, reactions to medication both or allergic, itching and postpartum back pain. Confirmed with bedside nurse the patient's most recent platelet count. Confirmed with patient that they are not currently taking any anticoagulation, have any bleeding history or any family history of bleeding disorders. Patient expressed understanding and wished to proceed. All questions were answered. Sterile technique was used throughout the entire procedure. Please see nursing notes for vital signs. Test dose was given through epidural needle and negative prior to continuing to dose epidural or start infusion. Warning signs of high block given to the patient including shortness of breath, tingling/numbness in hands, complete motor block, or any concerning symptoms with instructions to call for help. Patient was given  instructions on fall risk and not to get out of bed. All questions and concerns addressed with instructions to call with any issues.     

## 2016-09-22 NOTE — Lactation Note (Signed)
This note was copied from a baby's chart. Lactation Consultation Note  Patient Name: Boy Lorenda IshiharaLena Covington Brooks ZOXWR'UToday's Date: 09/22/2016 Reason for consult: Initial assessment Infant is 8 hours old & seen by lactation for initial assessment. Baby was born at 40w & weighed 8lb 5.2oz at birth. This is mom's 3rd baby and she BF her other 2 children for 6 months & 7 months with no problems reported. FOB was changing baby & then mom latched baby in cradle hold on right breast; reminded mom & FOB to keep baby & mom belly to belly so his body is facing mom during BF. Baby latched & suckled but needed frequent stimulation to continue suckling; swallows were noted. Mom has large breasts and a short shafted nipple but baby was able to latch & sustained it throughout feeding. Baby's mouth was not very wide so showed mom how to apply a little pressure on his chin to widen his latch. Baby BF for ~20 mins & then came off. Mom's nipple was compressed slightly so showed mom & encouraged mom to make sure baby has a deeper/ wider latch at future feedings; mom agreeable. Provided mom with BF booklet, BF resources, & feeding log; mom made aware of O/P services, breastfeeding support groups, community resources, and our phone # for post-discharge questions. Mom encouraged to feed baby 8-12 times/24 hours and with feeding cues. Mom reports she has Medicaid but [facility] but plans to apply soon. Mom reports she has a Medela electric pump at home & she had a Manual pump on her bed. Mom reports no questions. Encouraged mom to ask for assistance at future feedings as needed.   Maternal Data Does the patient have breastfeeding experience prior to this delivery?: Yes  Feeding Feeding Type: Breast Fed Length of feed: 20 min  LATCH Score/Interventions Latch: Repeated attempts needed to sustain latch, nipple held in mouth throughout feeding, stimulation needed to elicit sucking reflex. Intervention(s):  Adjust position;Breast compression;Assist with latch  Audible Swallowing: A few with stimulation Intervention(s): Alternate breast massage  Type of Nipple: Flat (short shaft but comes out after BF) Intervention(s): No intervention needed  Comfort (Breast/Nipple): Soft / non-tender     Hold (Positioning): Assistance needed to correctly position infant at breast and maintain latch.  LATCH Score: 6  Lactation Tools Discussed/Used WIC Program: No (plans to apply soon)   Consult Status Consult Status: Follow-up Date: 09/23/16 Follow-up type: In-patient    Oneal GroutLaura C Tynleigh Birt 09/22/2016, 2:26 PM

## 2016-09-22 NOTE — Progress Notes (Signed)
Faculty Practice OB/GYN Attending Note  25 y.o. [id number] PPD#0 s/p SVD who initially desired permanent sterilization.  Risks of procedure discussed with patient including but not limited to: risk of regret, permanence of method, bleeding, infection, injury to surrounding organs and need for additional procedures.  Failure risk of 1-2 % with increased risk of ectopic gestation if pregnancy occurs was also discussed with patient. Other forms of contraception with equal or greater efficacy such as vasectomy, IUD or Nexplaon were discussed with patient in detail; she is considering IUD vs Nexplanon. May also decide to do BTS tomorrow or as an interval procedure; epidural catheter to remain in place for now. Will cancel procedure for today, patient advised to let us know if she wants to have this tomorrow as she will need to be NPO after midnight and it will have to be on the schedule.  Routine postpartum care for now. OR and Anesthesia notified.    Jaynie CollinsUGONNA  Season Astacio, MD, FACOG Attending Obstetrician & Gynecologist Faculty Practice, [facility]

## 2016-09-23 LAB — CBC
HEMATOCRIT: 29.5 % — AB (ref 36.0–46.0)
HEMOGLOBIN: 9.6 g/dL — AB (ref 12.0–15.0)
MCH: 28.9 pg (ref 26.0–34.0)
MCHC: 32.5 g/dL (ref 30.0–36.0)
MCV: 88.9 fL (ref 78.0–100.0)
Platelets: 183 10*3/uL (ref 150–400)
RBC: 3.32 MIL/uL — AB (ref 3.87–5.11)
RDW: 14.2 % (ref 11.5–15.5)
WBC: 15.1 10*3/uL — ABNORMAL HIGH (ref 4.0–10.5)

## 2016-09-23 MED ORDER — SENNOSIDES-DOCUSATE SODIUM 8.6-50 MG PO TABS: 2.0000 | ORAL_TABLET | ORAL | 1 refills | Status: DC

## 2016-09-23 NOTE — Discharge Summary (Signed)
OB Discharge Summary     Patient Name: Karen IshiharaLena Karen Mcgee Karen Mcgee DOB: 07/27/1991 MRN: [id number]  Date of admission: 09/21/2016 Delivering MD: Jen MowMUMAW, [facility]   Date of discharge: 09/23/2016  Admitting diagnosis: 40WKS,WATER BROKE Desire Sterilization Intrauterine pregnancy: 3681w0d     Secondary diagnosis:  Active Problems:   Meconium in amniotic fluid  Additional problems: None     Discharge diagnosis: Term Pregnancy Delivered                                                                                                Post partum procedures:None  Augmentation: Pitocin, Cytotec and Foley Balloon  Complications: None  Hospital course:  Onset of Labor With Vaginal Delivery     25 y.o. yo [id number] at 5881w0d was admitted in Latent Labor on 09/21/2016. Patient had an uncomplicated labor course as follows:  Membrane Rupture Time/Date: 7:45 AM ,09/21/2016   Intrapartum Procedures: Episiotomy: None [1]                                         Lacerations:  None [1]  Patient had a delivery of a Viable infant. 09/22/2016  Information for the patient's newborn:  Karen LinCovington Karen Mcgee, Boy Karen Karen Mcgee [[id number]][[id number]]  Delivery Method: Vaginal, Spontaneous Delivery (Filed from Delivery Summary)    Pateint had an uncomplicated postpartum course.  She is ambulating, tolerating a regular diet, passing flatus, and urinating well. Patient is discharged home in stable condition on 09/23/16.    Physical exam Vitals:   09/22/16 1437 09/22/16 2052 09/23/16 0634 09/23/16 0815  BP: 108/79 125/63 124/74 (!) 115/55  Pulse: 86 93 (!) 118 71  Resp: 18 18 19 20   Temp: 98.7 F (37.1 C) 98.4 F (36.9 C) 97.8 F (36.6 C)   TempSrc: Oral Axillary Oral Oral  SpO2:      Weight:      Height:       General: alert, cooperative and no distress Lochia: appropriate Uterine Fundus: firm Incision: N/A DVT Evaluation: No evidence of DVT seen on physical exam. Labs: Lab Results  Component Value Date   WBC  15.1 (H) 09/23/2016   HGB 9.6 (L) 09/23/2016   HCT 29.5 (L) 09/23/2016   MCV 88.9 09/23/2016   PLT 183 09/23/2016   CMP Latest Ref Rng & Units 04/30/2010  Glucose 70 - 99 mg/dL 81  BUN 6 - 23 mg/dL 5(L)  Creatinine 0.4 - 1.2 mg/dL 0.5  Sodium 962135 - 952145 mEq/L 140  Potassium 3.5 - 5.1 mEq/L 4.0  Chloride 96 - 112 mEq/L 106  CO2 19 - 32 mEq/L 25  Calcium 8.4 - 10.5 mg/dL 9.0    Discharge instruction: per After Visit Summary and "Baby and Me Booklet".  After visit meds:    Medication List    STOP taking these medications   cyclobenzaprine 10 MG tablet Commonly known as:  FLEXERIL   Prenatal Vitamins 0.8 MG tablet     TAKE these medications   senna-docusate 8.6-50 MG tablet Commonly known  as:  Senokot-S Take 2 tablets by mouth daily. Start taking on:  09/24/2016       Diet: routine diet  Activity: Advance as tolerated. Pelvic rest for 6 weeks.   Outpatient follow up:6 weeks Follow up Appt:Future Appointments Date Time Provider Department Center  09/27/2016 8:15 AM Levie Heritage, DO [facility] None   Follow up Visit:No Follow-up on file.  Postpartum contraception: IUD Mirena  Newborn Data: Live born female  Birth Weight: 8 lb 5.2 oz (3776 g) APGAR: 8, 9  Baby Feeding: Bottle and Breast Disposition:home with mother   09/23/2016 Deniece Ree, MD

## 2016-09-23 NOTE — Progress Notes (Signed)
Discharge instructions for both mother and infant given with pt understanding. Instructed pt to come up to nurses station to be escorted out the hospital.

## 2016-09-23 NOTE — Progress Notes (Signed)
Per labs ( see pregnancy) patient is Chlamydia positive. Rns have been unable to ask mom alone if she has been treated. Rn called Dr. Berton LanShank. Dr. Berton LanShank  will review the notes and see if she was treated.

## 2016-09-23 NOTE — Lactation Note (Signed)
This note was copied from a baby's chart. Lactation Consultation Note: follow up for lactation assist. Mother states that breastfeeding is going well. She denies having any difficulties. Mother feels her milk is coming in. Encouraged mother to conintnue to feed infant 8-12 times in 24 hours.  Patient Name: Boy Karen IshiharaLena Mcgee Brooks ZOXWR'UToday's Date: 09/23/2016 Reason for consult: Follow-up assessment   Maternal Data    Feeding    LATCH Score/Interventions                      Lactation Tools Discussed/Used     Consult Status Consult Status: Follow-up Date: 09/24/16 Follow-up type: In-patient    Stevan BornKendrick, Franco Duley [facility] 09/23/2016, 3:12 PM

## 2016-09-27 ENCOUNTER — Encounter: Payer: Medicaid Other | Admitting: Family Medicine

## 2016-10-19 IMAGING — US US OB COMP LESS 14 WK
1 series · 14 of 28 positions shown · non-contrast
Comparison: None.

CLINICAL DATA: Pregnant patient in first-trimester pregnancy with
pelvic pain for 1 week.

EXAM:
OBSTETRIC <14 WK US AND TRANSVAGINAL OB US
TECHNIQUE: Both transabdominal and transvaginal ultrasound examinations were
performed for complete evaluation of the gestation as well as the
maternal uterus, adnexal regions, and pelvic cul-de-sac.
Transvaginal technique was performed to assess early pregnancy.

[Series 1: us ob comp less 14 wk · 0.10mm/px · 42 acquisitions, 14 frames shown]
[im 2/42]
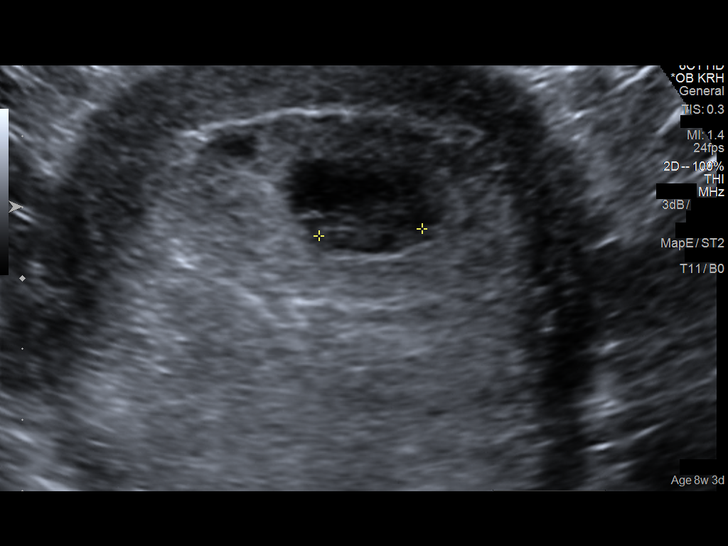
[im 5/42]
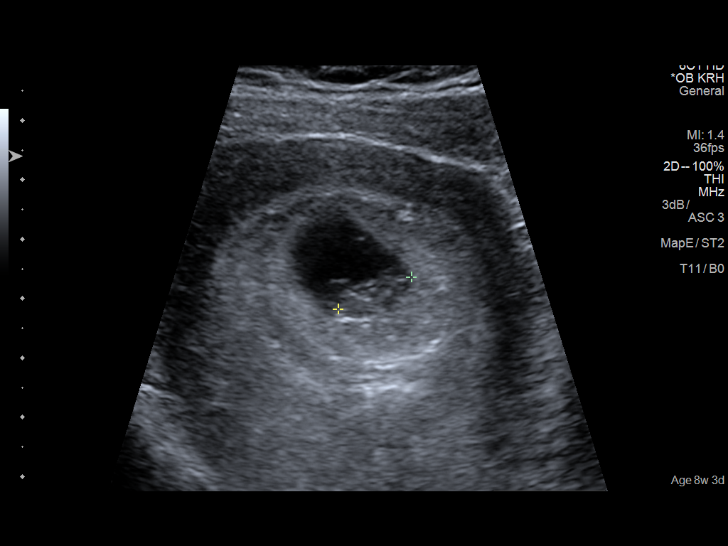
[im 8/42]
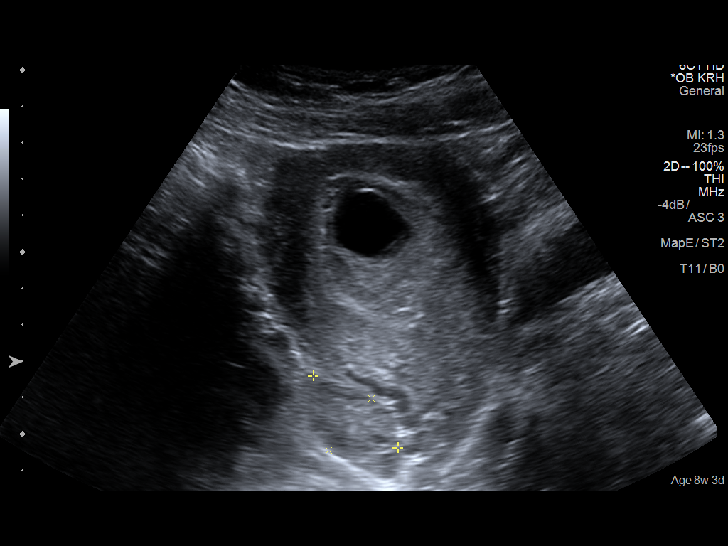
[im 11/42]
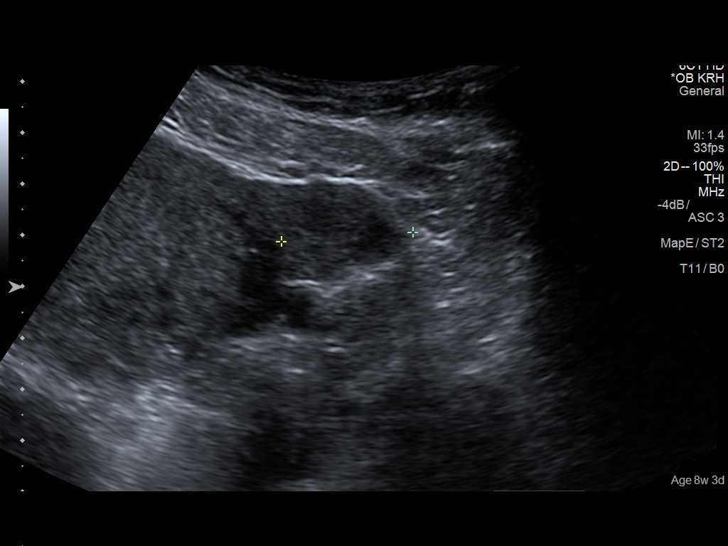
[im 14/42]
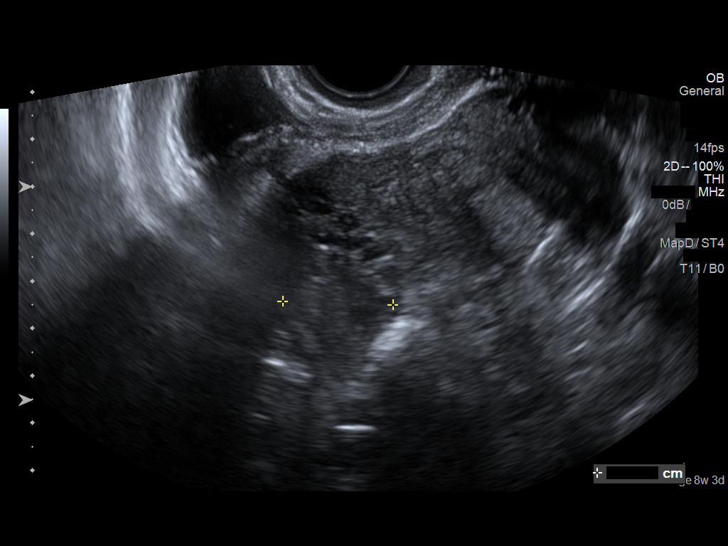
[im 17/42]
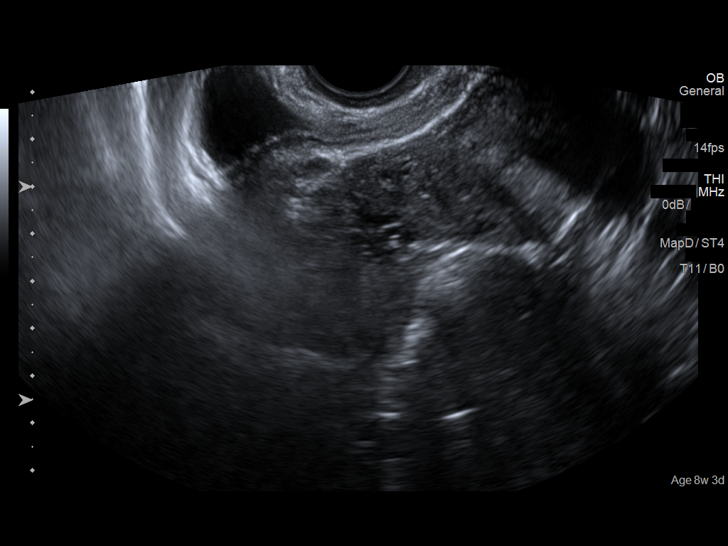
[im 20/42]
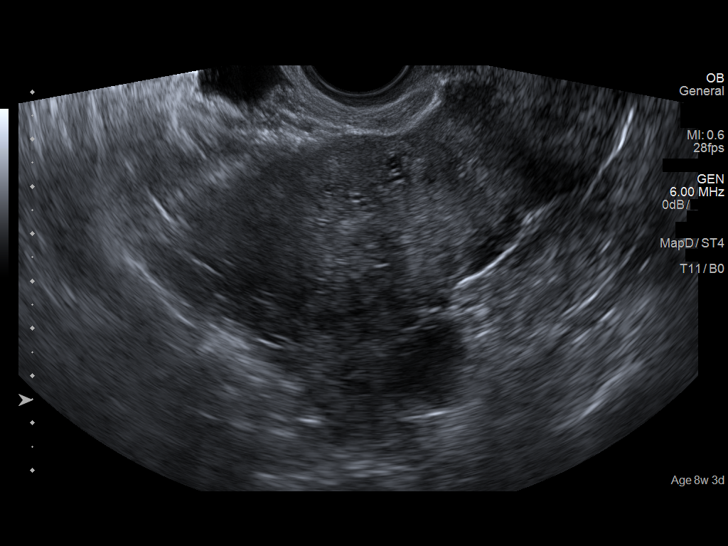
[im 23/42]
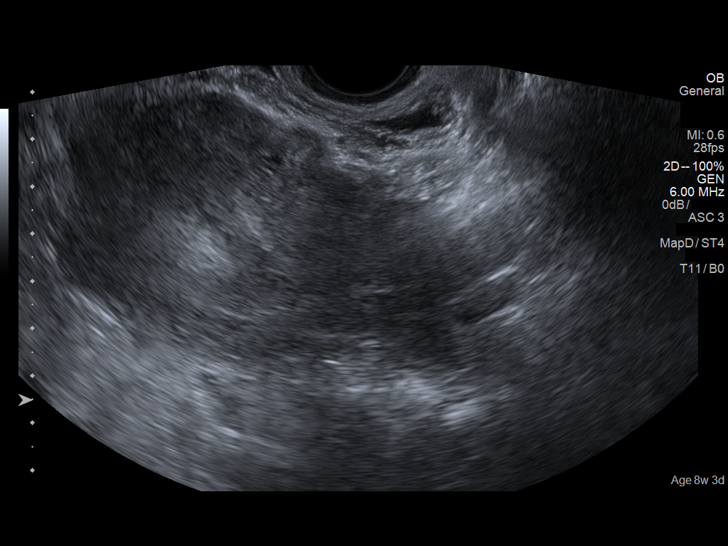
[im 26/42]
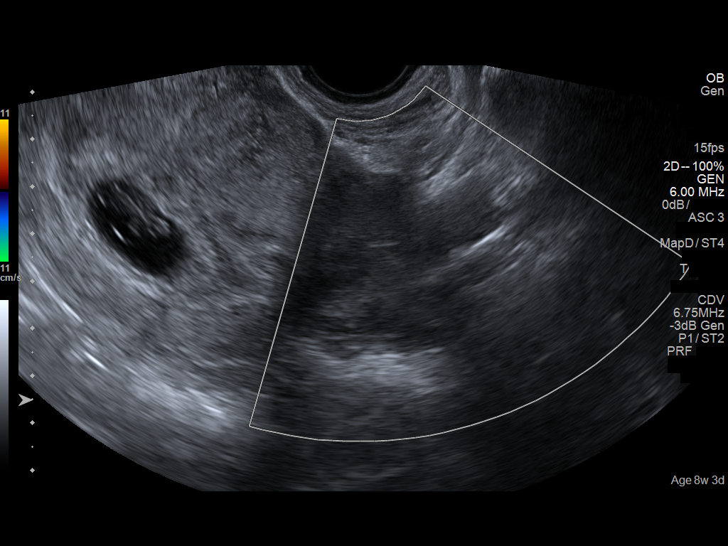
[im 29/42]
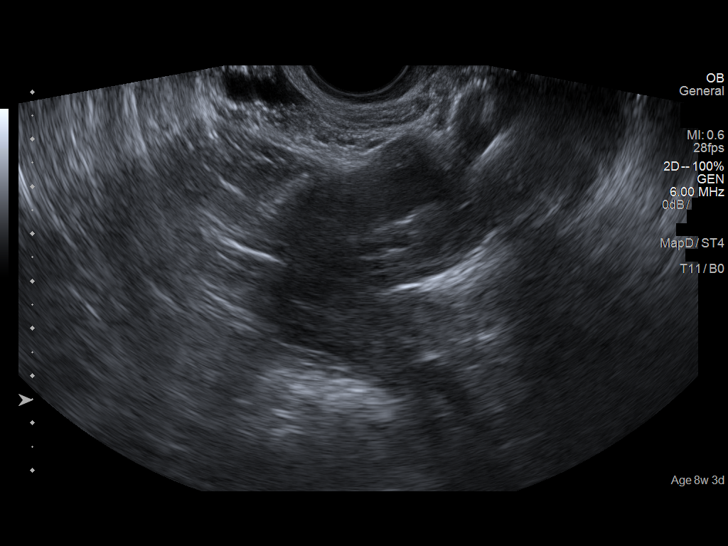
[im 32/42]
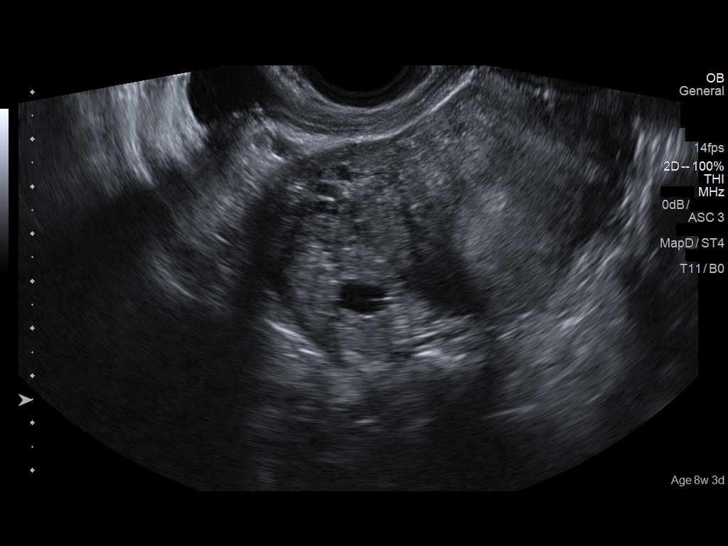
[im 35/42]
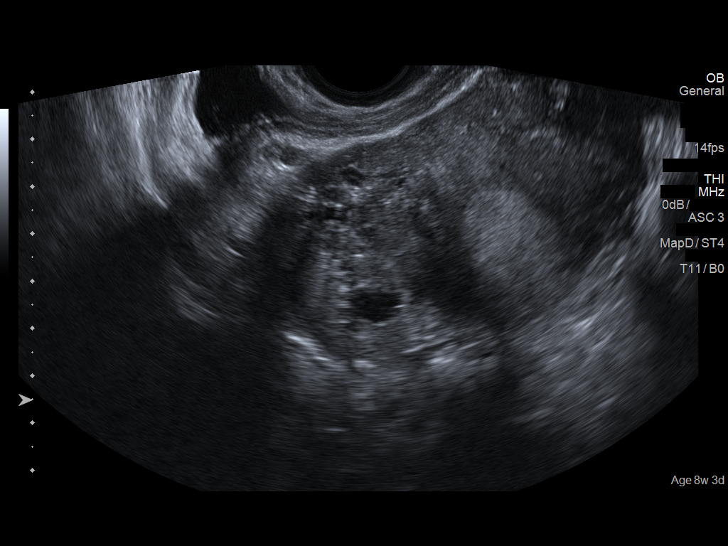
[im 38/42]
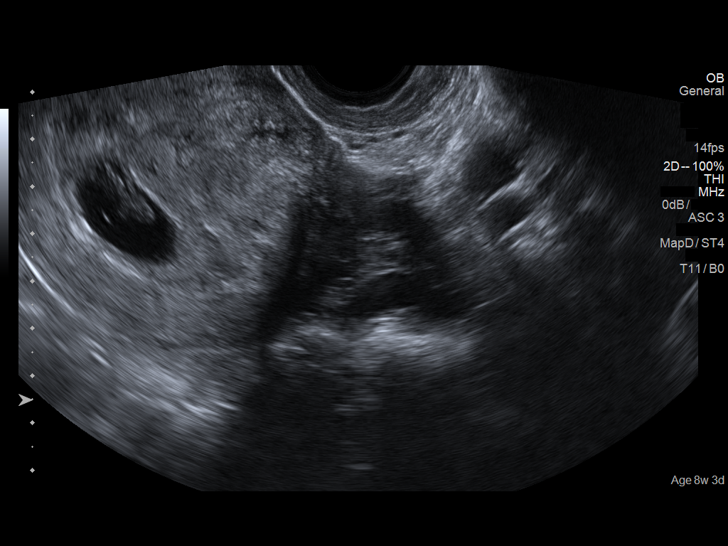
[im 42/42]
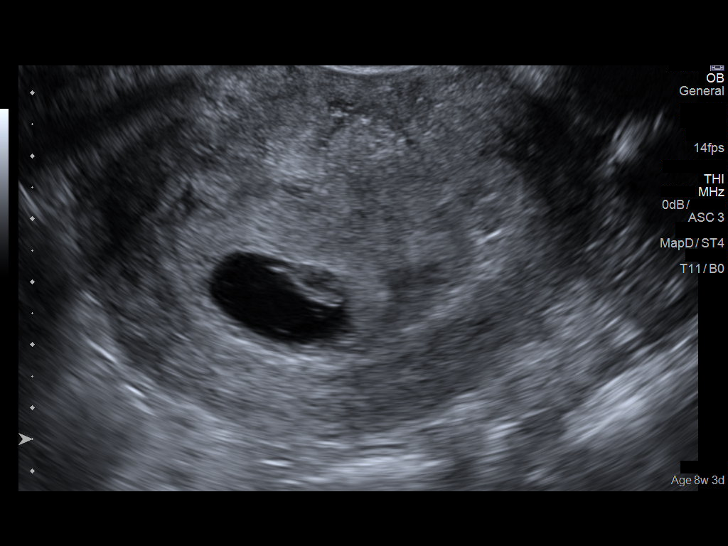

[14 of 28 positions shown; findings below may reference images not displayed]

FINDINGS: Intrauterine gestational sac: Visualized/normal in shape.

Yolk sac:  Present.

Embryo:  Present.

Cardiac Activity: Present.

Heart Rate: 153  bpm

CRL:  13  mm   7 w   4 d                  US EDC: 09/22/2016

Subchorionic hemorrhage:  None visualized.

Maternal uterus/adnexae: Both ovaries are visualized and are normal.
There is no pelvic free fluid or adnexal mass.
IMPRESSION: Single live intrauterine pregnancy estimated gestational age 7 weeks
4 days for estimated date of delivery 09/22/2016. No complication.

## 2016-11-07 ENCOUNTER — Ambulatory Visit (INDEPENDENT_AMBULATORY_CARE_PROVIDER_SITE_OTHER): Payer: Medicaid Other | Admitting: Family Medicine

## 2016-11-07 ENCOUNTER — Encounter: Payer: Self-pay | Admitting: Family Medicine

## 2016-11-07 NOTE — Progress Notes (Signed)
Subjective:     Lorenda IshiharaLena Covington Brooks is a 25 y.o. female who presents for a postpartum visit. She is 6 weeks postpartum following a spontaneous vaginal delivery. I have fully reviewed the prenatal and intrapartum course. The delivery was at 40 gestational weeks. Outcome: spontaneous vaginal delivery. Anesthesia: none. Postpartum course has been normal. Baby's course has been normal. Baby is feeding by breast. Bleeding started having period. Bowel function is normal. Bladder function is normal. Patient is sexually active. Contraception method is condoms. Postpartum depression screening: negative.  The following portions of the patient's history were reviewed and updated as appropriate: allergies, current medications, past family history, past medical history, past social history, past surgical history and problem list.  Review of Systems Pertinent items are noted in HPI.   Objective:    BP 118/75   Pulse 73   Ht 5\' 3"  (1.6 m)   Wt 222 lb (100.7 kg)   LMP  (LMP Unknown)   Breastfeeding? Yes   BMI 39.33 kg/m   General:  alert, cooperative and no distress  Lungs: clear to auscultation bilaterally  Heart:  regular rate and rhythm, S1, S2 normal, no murmur, click, rub or gallop  Abdomen: soft, non-tender; bowel sounds normal; no masses,  no organomegaly        Assessment:     Normal postpartum exam. Pap smear not done at today's visit.   Plan:    1. Contraception: tubal ligation - BTL papers signed 7/24. Will schedule to be done in the next couple of weeks. 2.  Follow up in: 1 month or as needed.

## 2016-11-09 ENCOUNTER — Encounter (HOSPITAL_COMMUNITY): Payer: Self-pay | Admitting: *Deleted

## 2016-11-22 NOTE — Patient Instructions (Addendum)
Your procedure is scheduled on:  Tuesday, Dec. 26, 2017  [facility] at:  2:30 PM  Pick up the phone at the desk and dial [phone].  Call this number if you have problems the morning of surgery: [phone].  Remember: Do NOT eat food:  After 8:00 AM day of surgery  Do NOT drink clear liquids after:  12 noon day of surgery  Take these medicines the morning of surgery with a SIP OF WATER:  None  Stop ALL herbal medications at this time   Do NOT wear jewelry (body piercing), metal hair clips/bobby pins, make-up, or nail polish. Do NOT wear lotions, powders, or perfumes.  You may wear deodorant. Do NOT shave for 48 hours prior to surgery. Do NOT bring valuables to the hospital. Contacts, dentures, or bridgework may not be worn into surgery.  Have a responsible adult drive you home and stay with you for 24 hours after your procedure

## 2016-11-23 ENCOUNTER — Encounter (HOSPITAL_COMMUNITY)
Admission: RE | Admit: 2016-11-23 | Discharge: 2016-11-23 | Disposition: A | Discharge status: Home / Self Care | Payer: Medicaid Other | Source: Ambulatory Visit | Attending: Obstetrics & Gynecology | Admitting: Obstetrics & Gynecology

## 2016-11-23 ENCOUNTER — Encounter (HOSPITAL_COMMUNITY): Payer: Self-pay

## 2016-11-23 DIAGNOSIS — Z01812 Encounter for preprocedural laboratory examination: Secondary | ICD-10-CM | POA: Diagnosis not present

## 2016-11-23 HISTORY — DX: Gastro-esophageal reflux disease without esophagitis: K21.9

## 2016-11-23 LAB — CBC
HCT: 36.9 % (ref 36.0–46.0)
Hemoglobin: 12.2 g/dL (ref 12.0–15.0)
MCH: 28.5 pg (ref 26.0–34.0)
MCHC: 33.1 g/dL (ref 30.0–36.0)
MCV: 86.2 fL (ref 78.0–100.0)
PLATELETS: 254 10*3/uL (ref 150–400)
RBC: 4.28 MIL/uL (ref 3.87–5.11)
RDW: 15.5 % (ref 11.5–15.5)
WBC: 9.2 10*3/uL (ref 4.0–10.5)

## 2016-11-27 ENCOUNTER — Ambulatory Visit (HOSPITAL_COMMUNITY): Payer: Medicaid Other | Admitting: Anesthesiology

## 2016-11-27 ENCOUNTER — Encounter (HOSPITAL_COMMUNITY): Payer: Self-pay

## 2016-11-27 ENCOUNTER — Encounter (HOSPITAL_COMMUNITY)
Admission: RE | Disposition: A | Discharge status: Home / Self Care | Payer: Self-pay | Source: Ambulatory Visit | Attending: Obstetrics & Gynecology

## 2016-11-27 ENCOUNTER — Ambulatory Visit (HOSPITAL_COMMUNITY)
Admission: RE | Admit: 2016-11-27 | Discharge: 2016-11-27 | Disposition: A | Discharge status: Home / Self Care | Payer: Medicaid Other | Source: Ambulatory Visit | Attending: Obstetrics & Gynecology | Admitting: Obstetrics & Gynecology

## 2016-11-27 DIAGNOSIS — Z302 Encounter for sterilization: Secondary | ICD-10-CM | POA: Diagnosis not present

## 2016-11-27 DIAGNOSIS — Z6838 Body mass index (BMI) 38.0-38.9, adult: Secondary | ICD-10-CM | POA: Diagnosis not present

## 2016-11-27 DIAGNOSIS — Z3009 Encounter for other general counseling and advice on contraception: Secondary | ICD-10-CM | POA: Diagnosis present

## 2016-11-27 HISTORY — PX: LAPAROSCOPIC TUBAL LIGATION: SHX1937

## 2016-11-27 LAB — PREGNANCY, URINE: Preg Test, Ur: NEGATIVE

## 2016-11-27 SURGERY — LIGATION, FALLOPIAN TUBE, LAPAROSCOPIC
Anesthesia: General | Site: Abdomen | Laterality: Bilateral

## 2016-11-27 MED ORDER — SUCCINYLCHOLINE CHLORIDE 200 MG/10ML IV SOSY
PREFILLED_SYRINGE | INTRAVENOUS | Status: AC
  Filled 2016-11-27: qty 10

## 2016-11-27 MED ORDER — ONDANSETRON HCL 4 MG/2ML IJ SOLN
INTRAMUSCULAR | Status: DC | PRN
  Administered 2016-11-27: 4 mg via INTRAVENOUS

## 2016-11-27 MED ORDER — KETOROLAC TROMETHAMINE 30 MG/ML IJ SOLN
INTRAMUSCULAR | Status: AC
  Filled 2016-11-27: qty 1

## 2016-11-27 MED ORDER — KETOROLAC TROMETHAMINE 30 MG/ML IJ SOLN: 30.0000 mg | Freq: Once | INTRAMUSCULAR | Status: DC | PRN

## 2016-11-27 MED ORDER — ONDANSETRON HCL 4 MG/2ML IJ SOLN
INTRAMUSCULAR | Status: AC
  Filled 2016-11-27: qty 2

## 2016-11-27 MED ORDER — KETOROLAC TROMETHAMINE 30 MG/ML IJ SOLN
INTRAMUSCULAR | Status: DC | PRN
  Administered 2016-11-27: 30 mg via INTRAVENOUS

## 2016-11-27 MED ORDER — MIDAZOLAM HCL 2 MG/2ML IJ SOLN
INTRAMUSCULAR | Status: DC | PRN
  Administered 2016-11-27: 2 mg via INTRAVENOUS

## 2016-11-27 MED ORDER — BUPIVACAINE HCL (PF) 0.5 % IJ SOLN
INTRAMUSCULAR | Status: AC
  Filled 2016-11-27: qty 30

## 2016-11-27 MED ORDER — FENTANYL CITRATE (PF) 100 MCG/2ML IJ SOLN
INTRAMUSCULAR | Status: AC
  Filled 2016-11-27: qty 2

## 2016-11-27 MED ORDER — PROPOFOL 10 MG/ML IV BOLUS
INTRAVENOUS | Status: AC
  Filled 2016-11-27: qty 20

## 2016-11-27 MED ORDER — LACTATED RINGERS IV SOLN
INTRAVENOUS | Status: DC
  Administered 2016-11-27 (×2): via INTRAVENOUS

## 2016-11-27 MED ORDER — SUCCINYLCHOLINE CHLORIDE 20 MG/ML IJ SOLN
INTRAMUSCULAR | Status: DC | PRN
  Administered 2016-11-27: 140 mg via INTRAVENOUS

## 2016-11-27 MED ORDER — BUPIVACAINE HCL 0.5 % IJ SOLN
INTRAMUSCULAR | Status: DC | PRN
  Administered 2016-11-27: 30 mL

## 2016-11-27 MED ORDER — IBUPROFEN 600 MG PO TABS: 600.0000 mg | ORAL_TABLET | Freq: Four times a day (QID) | ORAL | 0 refills | Status: DC | PRN

## 2016-11-27 MED ORDER — SCOPOLAMINE 1 MG/3DAYS TD PT72
MEDICATED_PATCH | TRANSDERMAL | Status: AC
  Administered 2016-11-27: 1.5 mg via TRANSDERMAL
  Filled 2016-11-27: qty 1

## 2016-11-27 MED ORDER — LACTATED RINGERS IV SOLN: INTRAVENOUS | Status: DC

## 2016-11-27 MED ORDER — FENTANYL CITRATE (PF) 100 MCG/2ML IJ SOLN
INTRAMUSCULAR | Status: DC | PRN
  Administered 2016-11-27: 50 ug via INTRAVENOUS
  Administered 2016-11-27: 100 ug via INTRAVENOUS

## 2016-11-27 MED ORDER — LIDOCAINE HCL (CARDIAC) 20 MG/ML IV SOLN
INTRAVENOUS | Status: DC | PRN
  Administered 2016-11-27: 80 mg via INTRAVENOUS

## 2016-11-27 MED ORDER — MIDAZOLAM HCL 2 MG/2ML IJ SOLN
INTRAMUSCULAR | Status: AC
Start: 2016-11-27 — End: 2016-11-27
  Filled 2016-11-27: qty 2

## 2016-11-27 MED ORDER — PROMETHAZINE HCL 25 MG/ML IJ SOLN: 6.2500 mg | INTRAMUSCULAR | Status: DC | PRN

## 2016-11-27 MED ORDER — LIDOCAINE HCL (CARDIAC) 20 MG/ML IV SOLN
INTRAVENOUS | Status: AC
  Filled 2016-11-27: qty 5

## 2016-11-27 MED ORDER — OXYCODONE-ACETAMINOPHEN 5-325 MG PO TABS: 1.0000 | ORAL_TABLET | Freq: Four times a day (QID) | ORAL | 0 refills | Status: DC | PRN

## 2016-11-27 MED ORDER — DEXAMETHASONE SODIUM PHOSPHATE 10 MG/ML IJ SOLN
INTRAMUSCULAR | Status: AC
  Filled 2016-11-27: qty 1

## 2016-11-27 MED ORDER — FENTANYL CITRATE (PF) 250 MCG/5ML IJ SOLN
INTRAMUSCULAR | Status: AC
  Filled 2016-11-27: qty 5

## 2016-11-27 MED ORDER — PROPOFOL 10 MG/ML IV BOLUS
INTRAVENOUS | Status: DC | PRN
  Administered 2016-11-27: 200 mg via INTRAVENOUS

## 2016-11-27 MED ORDER — MIDAZOLAM HCL 2 MG/2ML IJ SOLN
INTRAMUSCULAR | Status: AC
  Filled 2016-11-27: qty 2

## 2016-11-27 MED ORDER — DEXAMETHASONE SODIUM PHOSPHATE 10 MG/ML IJ SOLN
INTRAMUSCULAR | Status: DC | PRN
  Administered 2016-11-27: 4 mg via INTRAVENOUS

## 2016-11-27 MED ORDER — HYDROMORPHONE HCL 1 MG/ML IJ SOLN: 0.2500 mg | INTRAMUSCULAR | Status: DC | PRN

## 2016-11-27 MED ORDER — SCOPOLAMINE 1 MG/3DAYS TD PT72
1.0000 | MEDICATED_PATCH | Freq: Once | TRANSDERMAL | Status: DC
  Administered 2016-11-27: 1.5 mg via TRANSDERMAL

## 2016-11-27 SURGICAL SUPPLY — 25 items
CATH ROBINSON RED A/P 16FR (CATHETERS) ×3 IMPLANT
CLIP FILSHIE TUBAL LIGA STRL (Clip) ×12 IMPLANT
CLOTH BEACON ORANGE TIMEOUT ST (SAFETY) ×3 IMPLANT
DRSG OPSITE POSTOP 3X4 (GAUZE/BANDAGES/DRESSINGS) ×3 IMPLANT
DURAPREP 26ML APPLICATOR (WOUND CARE) ×3 IMPLANT
GLOVE BIO SURGEON STRL SZ7 (GLOVE) ×3 IMPLANT
GLOVE BIOGEL PI IND STRL 7.0 (GLOVE) ×2 IMPLANT
GLOVE BIOGEL PI INDICATOR 7.0 (GLOVE) ×4
GOWN STRL REUS W/TWL LRG LVL3 (GOWN DISPOSABLE) ×6 IMPLANT
GOWN STRL REUS W/TWL XL LVL3 (GOWN DISPOSABLE) ×3 IMPLANT
MANIPULATOR UTERINE 4.5 ZUMI (MISCELLANEOUS) ×3 IMPLANT
NEEDLE INSUFFLATION 120MM (ENDOMECHANICALS) ×3 IMPLANT
PACK LAPAROSCOPY BASIN (CUSTOM PROCEDURE TRAY) ×3 IMPLANT
PACK TRENDGUARD 450 HYBRID PRO (MISCELLANEOUS) ×1 IMPLANT
PACK TRENDGUARD 600 HYBRD PROC (MISCELLANEOUS) IMPLANT
PROTECTOR NERVE ULNAR (MISCELLANEOUS) ×6 IMPLANT
SUT VIC AB 3-0 PS2 18 (SUTURE) ×3 IMPLANT
SUT VICRYL 0 UR6 27IN ABS (SUTURE) ×3 IMPLANT
TOWEL OR 17X24 6PK STRL BLUE (TOWEL DISPOSABLE) ×6 IMPLANT
TRENDGUARD 450 HYBRID PRO PACK (MISCELLANEOUS) ×3
TRENDGUARD 600 HYBRID PROC PK (MISCELLANEOUS)
TROCAR XCEL DIL TIP R 11M (ENDOMECHANICALS) ×3 IMPLANT
TROCAR XCEL NON-BLD 5MMX100MML (ENDOMECHANICALS) IMPLANT
WARMER LAPAROSCOPE (MISCELLANEOUS) ×3 IMPLANT
WATER STERILE IRR 1000ML POUR (IV SOLUTION) ×3 IMPLANT

## 2016-11-27 NOTE — Op Note (Signed)
11/27/2016  4:09 PM  PATIENT:  Karen Mcgee  25 y.o. female  PRE-OPERATIVE DIAGNOSIS:  Undesired fertility  POST-OPERATIVE DIAGNOSIS:  Undesired fertility  PROCEDURE:  Procedure(s): LAPAROSCOPIC TUBAL LIGATION (Bilateral)  SURGEON:  Surgeon(s) and Role:    * Willodean Rosenthalarolyn Harraway-Smith, MD - Primary  ANESTHESIA:   local and general  EBL:  Total I/O In: 1000 [I.V.:1000] Out: 50 [Urine:50]  BLOOD ADMINISTERED:none  DRAINS: none   LOCAL MEDICATIONS USED:  MARCAINE     SPECIMEN:  No Specimen  DISPOSITION OF SPECIMEN:  PATHOLOGY  COUNTS:  YES  TOURNIQUET:  * No tourniquets in log *  DICTATION: .Note written in EPIC  PLAN OF CARE: Discharge to home after PACU  PATIENT DISPOSITION:  PACU - hemodynamically stable.   Delay start of Pharmacological VTE agent (>24hrs) due to surgical blood loss or risk of bleeding: not applicable  Complications: none immediate  INDICATIONS: 25 y.o. [id number]  with undesired fertility, desires permanent sterilization. Other reversible forms of contraception were discussed with patient; she declines all other modalities.  Risks of procedure discussed with patient including permanence of method, bleeding, infection, injury to surrounding organs and need for additional procedures including laparotomy, risk of regret.  Failure risk of 0.5-1% with increased risk of ectopic gestation if pregnancy occurs was also discussed with patient.      FINDINGS:  Normal uterus, tubes, and ovaries.  TECHNIQUE:  The patient was taken to the operating room where general anesthesia was obtained without difficulty.  She was then placed in the dorsal lithotomy position and prepared and draped in sterile fashion.  After an adequate timeout was performed, a bivalved speculum was then placed in the patient's vagina, and the anterior lip of cervix grasped with the single-tooth tenaculum.  The uterine manipulator was then advanced into the uterus.  The speculum was  removed from the vagina.  Attention was then turned to the patient's abdomen where a 11-mm skin incision was made in the umbilical fold.  The Veress needle was carefully introduced into the peritoneal cavity through the abdominal wall.  Intraperitoneal placement was confirmed by drop in intraabdominal pressure with insufflation of carbon dioxide gas.  Adequate pneumoperitoneum was obtained, and the 11-mm trocar and sleeve were then advanced without difficulty into the abdomen where intraabdominal placement was confirmed by the operative laparoscope. A survey of the patient's pelvis and abdomen revealed entirely normal anatomy.  The fallopian tubes were observed and found to be normal in appearance. The Filshie clip applicator was placed through the operative port, and a Filshie clip was placed on the right fallopian tube ,about 2 cm from the cornual attachment, with care given to incorporate the underlying mesosalpinx.  Th efallopian tubes were thick and the Filshie clip did not appear to go across the entire tube.  A second Filshie clip was applied with the same result so a bipolar device was used to burn the fallopian tube lateral to the Filship clips at the top and bottom of the fallopian tube. A similar process was carried out on the contralateral side allowing for bilateral tubal sterilization.   Good hemostasis was noted overall. The instruments were then removed from the patient's abdomen and the fascial incision was repaired with 0 Vicryl, and the skin was closed with 4-0 Vicryl.  30cc of 0.5% Marcaine was injected into the incision. The uterine manipulator and the tenaculum were removed from the vagina without complications. The patient tolerated the procedure well.  Sponge, lap, and needle counts were correct times  two.  The patient was then taken to the recovery room awake, extubated and in stable condition.  Osten Janek L. Harraway-Smith, M.D., [facility] CoreFACOG

## 2016-11-27 NOTE — H&P (Signed)
Preoperative History and Physical  Karen IshiharaLena Mcgee is a 25 y.o. [phone] [id number] here for surgical management of sterilization.   Proposed surgery: bilateral tubal ligation using Filshie clips.    Past Medical History:  Diagnosis Date  . GERD (gastroesophageal reflux disease)    with pregnancy  . Medical history non-contributory    Past Surgical History:  Procedure Laterality Date  . pilonidal cystectomy     OB History    Gravida Para Term Preterm AB Living   3 3 3     3    SAB TAB Ectopic Multiple Live Births         0 3     Patient denies any cervical dysplasia or STIs. Prescriptions Prior to Admission  Medication Sig Dispense Refill Last Dose  . Prenatal MV-Min-FA-Omega-3 (PRENATAL GUMMIES/DHA & FA) 0.4-32.5 MG CHEW Chew 3 each by mouth daily.   unknown at unknown    No Known Allergies Social History:   reports that she has never smoked. She has never used smokeless tobacco. She reports that she does not drink alcohol or use drugs. History reviewed. No pertinent family history.  Review of Systems: Noncontributory  PHYSICAL EXAM: Blood pressure 128/85, pulse 78, temperature 98.8 F (37.1 C), temperature source Oral, resp. rate 16, last menstrual period 11/12/2016, SpO2 98 %, currently breastfeeding. General appearance - alert, well appearing, and in no distress Chest - clear to auscultation, no wheezes, rales or rhonchi, symmetric air entry Heart - normal rate and regular rhythm Abdomen - soft, nontender, nondistended, no masses or organomegaly Pelvic - examination not indicated Extremities - peripheral pulses normal, no pedal edema, no clubbing or cyanosis  Labs: Results for orders placed or performed during the hospital encounter of 11/23/16 (from the past 336 hour(s))  CBC   Collection Time: 11/23/16  2:10 PM  Result Value Ref Range   WBC 9.2 4.0 - 10.5 K/uL   RBC 4.28 3.87 - 5.11 MIL/uL   Hemoglobin 12.2 12.0 - 15.0 g/dL   HCT 45.436.9 09.836.0 - 11.946.0 %   MCV 86.2 78.0  - 100.0 fL   MCH 28.5 26.0 - 34.0 pg   MCHC 33.1 30.0 - 36.0 g/dL   RDW 14.715.5 82.911.5 - 56.215.5 %   Platelets 254 150 - 400 K/uL    Imaging Studies: No results found.  Assessment: Patient Active Problem List   Diagnosis Date Noted  . Encounter for female sterilization procedure 11/27/2016  . Sterilization consult 07/09/2016    Plan: Patient will undergo surgical management with bilateral tubal ligation using Filshie clips.   The risks of surgery were discussed in detail with the patient including but not limited to: bleeding which may require transfusion or reoperation; infection which may require antibiotics; injury to surrounding organs which may involve bowel, bladder, ureters ; need for additional procedures including laparoscopy or laparotomy; thromboembolic phenomenon, surgical site problems and other postoperative/anesthesia complications. Likelihood of success in alleviating the patient's condition was discussed. Risk of failure of 3-03/999. Increased of ectopic pregnancy if pregnancy should occur. Routine postoperative instructions will be reviewed with the patient and her family in detail after surgery.  The patient concurred with the proposed plan, giving informed written consent for the surgery.  Patient has been NPO since last night she will remain NPO for procedure.  Anesthesia and OR aware.  Preoperative prophylactic antibiotics and SCDs ordered on call to the OR.  To OR when ready.  Ynez Eugenio L. Erin FullingHarraway-Smith, M.D., [facility] 11/27/2016 2:39 PM

## 2016-11-27 NOTE — Transfer of Care (Signed)
Immediate Anesthesia Transfer of Care Note  Patient: Karen Mcgee  Procedure(s) Performed: Procedure(s): LAPAROSCOPIC TUBAL LIGATION (Bilateral)  Patient Location: PACU  Anesthesia Type:General  Level of Consciousness: sedated  Airway & Oxygen Therapy: Patient Spontanous Breathing and Patient connected to nasal cannula oxygen  Post-op Assessment: Report given to RN  Post vital signs: Reviewed and stable  Last Vitals:  Vitals:   11/27/16 1432 11/27/16 1627  BP: 128/85   Pulse: 78   Resp: 16 16  Temp: 37.1 C 36.6 C    Last Pain:  Vitals:   11/27/16 1432  TempSrc: Oral         Complications: No apparent anesthesia complications

## 2016-11-27 NOTE — Anesthesia Preprocedure Evaluation (Signed)
Anesthesia Evaluation  Patient identified by MRN, date of birth, ID band Patient awake    Reviewed: Allergy & Precautions, NPO status , Patient's Chart, lab work & pertinent test results  Airway Mallampati: II  TM Distance: >3 FB Neck ROM: Full    Dental  (+) Dental Advisory Given   Pulmonary neg pulmonary ROS,    breath sounds clear to auscultation       Cardiovascular negative cardio ROS   Rhythm:Regular Rate:Normal     Neuro/Psych negative neurological ROS     GI/Hepatic Neg liver ROS, GERD  ,  Endo/Other  Morbid obesity  Renal/GU negative Renal ROS     Musculoskeletal   Abdominal   Peds  Hematology negative hematology ROS (+)   Anesthesia Other Findings   Reproductive/Obstetrics                             Lab Results  Component Value Date   WBC 9.2 11/23/2016   HGB 12.2 11/23/2016   HCT 36.9 11/23/2016   MCV 86.2 11/23/2016   PLT 254 11/23/2016   Lab Results  Component Value Date   CREATININE 0.5 04/30/2010   BUN 5 (L) 04/30/2010   NA 140 04/30/2010   K 4.0 04/30/2010   CL 106 04/30/2010   CO2 25 04/30/2010    Anesthesia Physical Anesthesia Plan  ASA: II  Anesthesia Plan: General   Post-op Pain Management:    Induction: Intravenous  Airway Management Planned: Oral ETT  Additional Equipment:   Intra-op Plan:   Post-operative Plan: Extubation in OR  Informed Consent: I have reviewed the patients History and Physical, chart, labs and discussed the procedure including the risks, benefits and alternatives for the proposed anesthesia with the patient or authorized representative who has indicated his/her understanding and acceptance.   Dental advisory given  Plan Discussed with: CRNA  Anesthesia Plan Comments:         Anesthesia Quick Evaluation

## 2016-11-27 NOTE — Anesthesia Preprocedure Evaluation (Addendum)
Anesthesia Evaluation  Patient identified by MRN, date of birth, ID band Patient awake    Reviewed: Allergy & Precautions, NPO status , Patient's Chart, lab work & pertinent test results  Airway Mallampati: II  TM Distance: >3 FB Neck ROM: Full    Dental  (+) Teeth Intact, Dental Advisory Given   Pulmonary neg pulmonary ROS,    breath sounds clear to auscultation       Cardiovascular negative cardio ROS   Rhythm:Regular Rate:Normal     Neuro/Psych negative neurological ROS  negative psych ROS   GI/Hepatic Neg liver ROS,   Endo/Other  negative endocrine ROS  Renal/GU negative Renal ROS  negative genitourinary   Musculoskeletal negative musculoskeletal ROS (+)   Abdominal   Peds negative pediatric ROS (+)  Hematology negative hematology ROS (+)   Anesthesia Other Findings   Reproductive/Obstetrics negative OB ROS                            Lab Results  Component Value Date   WBC 9.2 11/23/2016   HGB 12.2 11/23/2016   HCT 36.9 11/23/2016   MCV 86.2 11/23/2016   PLT 254 11/23/2016   Lab Results  Component Value Date   CREATININE 0.5 04/30/2010   BUN 5 (L) 04/30/2010   NA 140 04/30/2010   K 4.0 04/30/2010   CL 106 04/30/2010   CO2 25 04/30/2010   No results found for: INR, PROTIME   Anesthesia Physical Anesthesia Plan  ASA: II  Anesthesia Plan: General   Post-op Pain Management:    Induction: Intravenous  Airway Management Planned: Oral ETT  Additional Equipment:   Intra-op Plan:   Post-operative Plan: Extubation in OR  Informed Consent: I have reviewed the patients History and Physical, chart, labs and discussed the procedure including the risks, benefits and alternatives for the proposed anesthesia with the patient or authorized representative who has indicated his/her understanding and acceptance.   Dental advisory given  Plan Discussed with:  CRNA  Anesthesia Plan Comments:         Anesthesia Quick Evaluation

## 2016-11-27 NOTE — Anesthesia Procedure Notes (Signed)
Procedure Name: Intubation Date/Time: 11/27/2016 3:19 PM Performed by: Cephus ShellingBURGER, Erva Koke A Pre-anesthesia Checklist: Patient identified, Emergency Drugs available, Suction available and Patient being monitored Patient Re-evaluated:Patient Re-evaluated prior to inductionOxygen Delivery Method: Circle system utilized Preoxygenation: Pre-oxygenation with 100% oxygen Intubation Type: Combination inhalational/ intravenous induction and Cricoid Pressure applied Ventilation: Mask ventilation without difficulty Laryngoscope Size: Mac and 3 Grade View: Grade II Tube size: 7.0 mm Number of attempts: 1 Placement Confirmation: ETT inserted through vocal cords under direct vision,  positive ETCO2 and breath sounds checked- equal and bilateral Secured at: 20 cm Tube secured with: Tape Dental Injury: Teeth and Oropharynx as per pre-operative assessment

## 2016-11-27 NOTE — Discharge Instructions (Addendum)
Laparoscopic Tubal Ligation, Care After Refer to this sheet in the next few weeks. These instructions provide you with information about caring for yourself after your procedure. Your health care provider may also give you more specific instructions. Your treatment has been planned according to current medical practices, but problems sometimes occur. Call your health care provider if you have any problems or questions after your procedure. What can I expect after the procedure? After the procedure, it is common to have:  A sore throat.  Discomfort in your shoulder.  Mild discomfort or cramping in your abdomen.  Gas pains.  Pain or soreness in the area where the surgical cut (incision) was made.  A bloated feeling.  Tiredness. Follow these instructions at home: Medicines  Take over-the-counter and prescription medicines only as told by your health care provider.  Do not take aspirin because it can cause bleeding.  Do not drive or operate heavy machinery while taking prescription pain medicine. Activity  Rest for the rest of the day.  Return to your normal activities as told by your health care provider. Ask your health care provider what activities are safe for you. Incision care  Follow instructions from your health care provider about how to take care of your incision. Make sure you:  Wash your hands with soap and water before you change your bandage (dressing). If soap and water are not available, use hand sanitizer.  Change your dressing as told by your health care provider.  Leave stitches (sutures) in place. They may need to stay in place for 2 weeks or longer.  Check your incision area every day for signs of infection. Check for:  More redness, swelling, or pain.  More fluid or blood.  Warmth.  Pus or a bad smell. Other Instructions  Do not take baths, swim, or use a hot tub until your health care provider approves. You may take showers.  Keep all follow-up  visits as told by your health care provider. This is important.  Have someone help you with your daily household tasks for the first few days. Contact a health care provider if:  You have more redness, swelling, or pain around your incision.  Your incision feels warm to the touch.  You have pus or a bad smell coming from your incision.  The edges of your incision break open after the sutures have been removed.  Your pain does not improve after 2-3 days.  You have a rash.  You repeatedly become dizzy or light-headed.  Your pain medicine is not helping.  You are constipated. Get help right away if:  You have a fever.  You faint.  You have increasing pain in your abdomen.  You have severe pain in one or both of your shoulders.  You have fluid or blood coming from your sutures or from your vagina.  You have shortness of breath or difficulty breathing.  You have chest pain or leg pain.  You have ongoing nausea, vomiting, or diarrhea. This information is not intended to replace advice given to you by your health care provider. Make sure you discuss any questions you have with your health care provider. Document Released: 06/08/2005 Document Revised: 04/23/2016 Document Reviewed: 10/30/2015 Elsevier Interactive Patient Education  2017 [employer]   Post Anesthesia Home Care Instructions  NO IBUPROFEN PRODUCTS UNTIL: 10:00 PM TONIGHT  Activity: Get plenty of rest for the remainder of the day. A responsible adult should stay with you for 24 hours following the procedure.  For the next 24 hours, DO NOT: -Drive a car -Advertising copywriterperate machinery -Drink alcoholic beverages -Take any medication unless instructed by your physician -Make any legal decisions or sign important papers.  Meals: Start with liquid foods such as gelatin or soup. Progress to regular foods as tolerated. Avoid greasy, spicy, heavy foods. If nausea and/or vomiting occur, drink only clear liquids until the  nausea and/or vomiting subsides. Call your physician if vomiting continues.  Special Instructions/Symptoms: Your throat may feel dry or sore from the anesthesia or the breathing tube placed in your throat during surgery. If this causes discomfort, gargle with warm salt water. The discomfort should disappear within 24 hours.  If you had a scopolamine patch placed behind your ear for the management of post- operative nausea and/or vomiting:  1. The medication in the patch is effective for 72 hours, after which it should be removed.  Wrap patch in a tissue and discard in the trash. Wash hands thoroughly with soap and water. 2. You may remove the patch earlier than 72 hours if you experience unpleasant side effects which may include dry mouth, dizziness or visual disturbances. 3. Avoid touching the patch. Wash your hands with soap and water after contact with the patch.

## 2016-11-27 NOTE — Anesthesia Postprocedure Evaluation (Signed)
Anesthesia Post Note  Patient: Karen Mcgee  Procedure(s) Performed: Procedure(s) (LRB): LAPAROSCOPIC TUBAL LIGATION (Bilateral)  Patient location during evaluation: PACU Anesthesia Type: General Level of consciousness: awake and alert Pain management: pain level controlled Vital Signs Assessment: post-procedure vital signs reviewed and stable Respiratory status: spontaneous breathing, nonlabored ventilation, respiratory function stable and patient connected to nasal cannula oxygen Cardiovascular status: blood pressure returned to baseline and stable Postop Assessment: no signs of nausea or vomiting Anesthetic complications: no        Last Vitals:  Vitals:   11/27/16 1715 11/27/16 1730  BP: 131/79 133/73  Pulse: (!) 58 61  Resp: 13 14  Temp:      Last Pain:  Vitals:   11/27/16 1730  TempSrc:   PainSc: 0-No pain   Pain Goal: Patients Stated Pain Goal: 3 (11/27/16 1730)               Kennieth RadFitzgerald, [name] E

## 2016-11-27 NOTE — Brief Op Note (Signed)
11/27/2016  4:09 PM  PATIENT:  Karen SalesLena Covington-Brooks  25 y.o. female  PRE-OPERATIVE DIAGNOSIS:  Undesired fertility  POST-OPERATIVE DIAGNOSIS:  Undesired fertility  PROCEDURE:  Procedure(s): LAPAROSCOPIC TUBAL LIGATION (Bilateral)  SURGEON:  Surgeon(s) and Role:    * Willodean Rosenthalarolyn Harraway-Smith, MD - Primary  ANESTHESIA:   local and general  EBL:  Total I/O In: 1000 [I.V.:1000] Out: 50 [Urine:50]  BLOOD ADMINISTERED:none  DRAINS: none   LOCAL MEDICATIONS USED:  MARCAINE     SPECIMEN:  No Specimen  DISPOSITION OF SPECIMEN:  PATHOLOGY  COUNTS:  YES  TOURNIQUET:  * No tourniquets in log *  DICTATION: .Note written in EPIC  PLAN OF CARE: Discharge to home after PACU  PATIENT DISPOSITION:  PACU - hemodynamically stable.   Delay start of Pharmacological VTE agent (>24hrs) due to surgical blood loss or risk of bleeding: not applicable  Complications: none immediate  Chaysen Tillman L. Harraway-Smith, M.D., Evern CoreFACOG

## 2016-11-29 ENCOUNTER — Encounter (HOSPITAL_COMMUNITY): Payer: Self-pay | Admitting: Obstetrics & Gynecology

## 2016-12-14 ENCOUNTER — Ambulatory Visit (INDEPENDENT_AMBULATORY_CARE_PROVIDER_SITE_OTHER): Payer: Medicaid Other | Admitting: Obstetrics & Gynecology

## 2016-12-14 ENCOUNTER — Encounter: Payer: Self-pay | Admitting: Obstetrics & Gynecology

## 2016-12-14 VITALS — BP 94/83 | HR 89 | Wt 200.0 lb

## 2016-12-14 DIAGNOSIS — Z9889 Other specified postprocedural states: Secondary | ICD-10-CM

## 2016-12-14 NOTE — Progress Notes (Signed)
History:  26 y.o. [id number] here today for 2 week post op check. She denies problems.  She is off all pain meds.  NO problems voiding or passing stools.    The following portions of the patient's history were reviewed and updated as appropriate: allergies, current medications, past family history, past medical history, past social history, past surgical history and problem list.  Review of Systems:  Pertinent items are noted in HPI.   Objective:  Physical Exam Blood pressure 94/83, pulse 89, weight 200 lb (90.7 kg), last menstrual period 11/12/2016, currently breastfeeding. Gen: NAD Abd: Soft, nontender and nondistended; incision: C/D/I   Assessment & Plan:  2 weeks post op from BTL- doing well  Rec f/u in 1 year for annual F/u sooner prn  Rane Dumm L. Harraway-Smith, M.D., Evern CoreFACOG

## 2016-12-14 NOTE — Patient Instructions (Signed)
Laparoscopic Tubal Ligation, Care After °Refer to this sheet in the next few weeks. These instructions provide you with information about caring for yourself after your procedure. Your health care provider may also give you more specific instructions. Your treatment has been planned according to current medical practices, but problems sometimes occur. Call your health care provider if you have any problems or questions after your procedure. °What can I expect after the procedure? °After the procedure, it is common to have: °· A sore throat. °· Discomfort in your shoulder. °· Mild discomfort or cramping in your abdomen. °· Gas pains. °· Pain or soreness in the area where the surgical cut (incision) was made. °· A bloated feeling. °· Tiredness. °· Nausea. °· Vomiting. °Follow these instructions at home: °Medicines °· Take over-the-counter and prescription medicines only as told by your health care provider. °· Do not take aspirin because it can cause bleeding. °· Do not drive or operate heavy machinery while taking prescription pain medicine. °Activity °· Rest for the rest of the day. °· Return to your normal activities as told by your health care provider. Ask your health care provider what activities are safe for you. °Incision care °· Follow instructions from your health care provider about how to take care of your incision. Make sure you: °¨ Wash your hands with soap and water before you change your bandage (dressing). If soap and water are not available, use hand sanitizer. °¨ Change your dressing as told by your health care provider. °¨ Leave stitches (sutures) in place. They may need to stay in place for 2 weeks or longer. °· Check your incision area every day for signs of infection. Check for: °¨ More redness, swelling, or pain. °¨ More fluid or blood. °¨ Warmth. °¨ Pus or a bad smell. °Other Instructions °· Do not take baths, swim, or use a hot tub until your health care provider approves. You may take  showers. °· Keep all follow-up visits as told by your health care provider. This is important. °· Have someone help you with your daily household tasks for the first few days. °Contact a health care provider if: °· You have more redness, swelling, or pain around your incision. °· Your incision feels warm to the touch. °· You have pus or a bad smell coming from your incision. °· The edges of your incision break open after the sutures have been removed. °· Your pain does not improve after 2-3 days. °· You have a rash. °· You repeatedly become dizzy or light-headed. °· Your pain medicine is not helping. °· You are constipated. °Get help right away if: °· You have a fever. °· You faint. °· You have increasing pain in your abdomen. °· You have severe pain in one or both of your shoulders. °· You have fluid or blood coming from your sutures or from your vagina. °· You have shortness of breath or difficulty breathing. °· You have chest pain or leg pain. °· You have ongoing nausea, vomiting, or diarrhea. °This information is not intended to replace advice given to you by your health care provider. Make sure you discuss any questions you have with your health care provider. °Document Released: 06/08/2005 Document Revised: 04/23/2016 Document Reviewed: 10/30/2015 °Elsevier Interactive Patient Education © 2017 [employer] Inc[employer] ° °

## 2016-12-24 ENCOUNTER — Encounter (HOSPITAL_COMMUNITY): Payer: Self-pay

## 2018-08-01 ENCOUNTER — Emergency Department (HOSPITAL_BASED_OUTPATIENT_CLINIC_OR_DEPARTMENT_OTHER): Payer: Self-pay

## 2018-08-01 ENCOUNTER — Emergency Department (HOSPITAL_BASED_OUTPATIENT_CLINIC_OR_DEPARTMENT_OTHER)
Admission: EM | Admit: 2018-08-01 | Discharge: 2018-08-01 | Disposition: A | Discharge status: Home / Self Care | Payer: Self-pay | Attending: Emergency Medicine | Admitting: Emergency Medicine

## 2018-08-01 ENCOUNTER — Encounter (HOSPITAL_BASED_OUTPATIENT_CLINIC_OR_DEPARTMENT_OTHER): Payer: Self-pay

## 2018-08-01 ENCOUNTER — Other Ambulatory Visit: Payer: Self-pay

## 2018-08-01 DIAGNOSIS — R0789 Other chest pain: Secondary | ICD-10-CM | POA: Insufficient documentation

## 2018-08-01 DIAGNOSIS — Z79899 Other long term (current) drug therapy: Secondary | ICD-10-CM | POA: Insufficient documentation

## 2018-08-01 DIAGNOSIS — R519 Headache, unspecified: Secondary | ICD-10-CM

## 2018-08-01 DIAGNOSIS — R51 Headache: Secondary | ICD-10-CM | POA: Insufficient documentation

## 2018-08-01 DIAGNOSIS — R079 Chest pain, unspecified: Secondary | ICD-10-CM

## 2018-08-01 LAB — BASIC METABOLIC PANEL
Anion gap: 9 (ref 5–15)
BUN: 15 mg/dL (ref 6–20)
CO2: 25 mmol/L (ref 22–32)
Calcium: 8.9 mg/dL (ref 8.9–10.3)
Chloride: 103 mmol/L (ref 98–111)
Creatinine, Ser: 0.62 mg/dL (ref 0.44–1.00)
GFR calc Af Amer: >60 mL/min (ref >60)
Glucose, Bld: 101 mg/dL — ABNORMAL HIGH (ref 70–99)
POTASSIUM: 3.7 mmol/L (ref 3.5–5.1)
Sodium: 137 mmol/L (ref 135–145)

## 2018-08-01 LAB — CBC
HCT: 36.5 % (ref 36.0–46.0)
Hemoglobin: 11.9 g/dL — ABNORMAL LOW (ref 12.0–15.0)
MCH: 29.6 pg (ref 26.0–34.0)
MCHC: 32.6 g/dL (ref 30.0–36.0)
MCV: 90.8 fL (ref 78.0–100.0)
PLATELETS: 259 10*3/uL (ref 150–400)
RBC: 4.02 MIL/uL (ref 3.87–5.11)
RDW: 13.4 % (ref 11.5–15.5)
WBC: 12.8 10*3/uL — AB (ref 4.0–10.5)

## 2018-08-01 LAB — PREGNANCY, URINE: PREG TEST UR: NEGATIVE

## 2018-08-01 MED ORDER — KETOROLAC TROMETHAMINE 30 MG/ML IJ SOLN
30.0000 mg | Freq: Once | INTRAMUSCULAR | Status: AC
  Administered 2018-08-01: 30 mg via INTRAVENOUS
  Filled 2018-08-01: qty 1

## 2018-08-01 MED ORDER — KETOROLAC TROMETHAMINE 30 MG/ML IJ SOLN: 30.0000 mg | Freq: Once | INTRAMUSCULAR | Status: DC

## 2018-08-01 NOTE — ED Provider Notes (Signed)
[facility] EMERGENCY DEPARTMENT Provider Note   CSN: [id number] Arrival date & time: 08/01/18  1854     History   Chief Complaint Chief Complaint  Patient presents with  . Chest Pain    HPI Karen Mcgee is a 27 y.o. female.  HPI  Karen Mcgee is a 27yo female with a history of GERD and headaches who presents to the emergency department for evaluation of left-sided chest pain.  Patient reports that she developed a gradual onset chest pain started while driving at 4:098:15 AM today.  She states that pain radiates to the middle of her chest.  Pain is sharp and twisting.  It seems to come and go.  Worsened with lifting her left arm or with belching.  She reports pain is about a 9/10 in severity right now.  She tried taking an antiacid at work, but this did not help with her symptoms.  She denies associated shortness of breath, diaphoresis, lightheadedness, dizziness or nausea/vomiting.  About an hour prior to arrival she developed a left-sided headache which has improved and is now mild in severity.  She denies tobacco use.  No family history of MI.  No history of exertional chest pain.  She denies fevers, chills, cough, abdominal pain, visual disturbance, numbness, weakness, syncope.  No history of DVT/PE, unilateral leg swelling or calf tenderness, recent surgery or immobilization, exogenous estrogen, hemoptysis or active cancer.  No strenuous lifting activity or chest trauma.  She denies recent anxiety or life stressors.  Past Medical History:  Diagnosis Date  . GERD (gastroesophageal reflux disease)    with pregnancy  . Medical history non-contributory     Patient Active Problem List   Diagnosis Date Noted  . Encounter for female sterilization procedure 11/27/2016  . Sterilization consult 07/09/2016    Past Surgical History:  Procedure Laterality Date  . LAPAROSCOPIC TUBAL LIGATION Bilateral 11/27/2016   Procedure: LAPAROSCOPIC TUBAL LIGATION;  Surgeon:  Willodean Rosenthalarolyn Harraway-Smith, MD;  Location: [facility];  Service: Gynecology;  Laterality: Bilateral;  . pilonidal cystectomy       OB History    Gravida  3   Para  3   Term  3   Preterm      AB      Living  3     SAB      TAB      Ectopic      Multiple  0   Live Births  3            Home Medications    Prior to Admission medications   Medication Sig Start Date End Date Taking? Authorizing Provider  ibuprofen (ADVIL,MOTRIN) 600 MG tablet Take 1 tablet (600 mg total) by mouth every 6 (six) hours as needed. Patient not taking: Reported on 12/14/2016 11/27/16   Willodean RosenthalHarraway-Smith, Carolyn, MD  oxyCODONE-acetaminophen (PERCOCET/ROXICET) 5-325 MG tablet Take 1-2 tablets by mouth every 6 (six) hours as needed. Patient not taking: Reported on 12/14/2016 11/27/16   Willodean RosenthalHarraway-Smith, Carolyn, MD  Prenatal MV-Min-FA-Omega-3 (PRENATAL GUMMIES/DHA & FA) 0.4-32.5 MG CHEW Chew 3 each by mouth daily.    [provider]    Family History No family history on file.  Social History Social History   Tobacco Use  . Smoking status: Never Smoker  . Smokeless tobacco: Never Used  Substance Use Topics  . Alcohol use: No  . Drug use: No     Allergies   Patient has no known allergies.   Review of Systems Review of  Systems  Constitutional: Negative for chills, diaphoresis and fever.  Eyes: Negative for visual disturbance.  Respiratory: Negative for cough and shortness of breath.   Cardiovascular: Positive for chest pain. Negative for palpitations and leg swelling.  Gastrointestinal: Negative for abdominal distention, abdominal pain, nausea and vomiting.  Genitourinary: Negative for difficulty urinating.  Musculoskeletal: Negative for back pain and gait problem.  Skin: Negative for rash.  Neurological: Negative for dizziness, syncope, weakness, light-headedness and numbness.  Psychiatric/Behavioral: Negative for agitation.     Physical Exam Updated Vital Signs BP (!)  145/46 (BP Location: Left Arm)   Pulse 97   Temp 98.8 F (37.1 C) (Oral)   Resp 20   Ht 5\' 3"  (1.6 m)   Wt 109.3 kg   LMP 07/10/2018   SpO2 100%   BMI 42.69 kg/m   Physical Exam  Constitutional: She is oriented to person, place, and time. She appears well-developed and well-nourished. No distress.  Sitting at bedside in no apparent distress, nontoxic-appearing.  No diaphoresis.  HENT:  Head: Normocephalic and atraumatic.  Mouth/Throat: Oropharynx is clear and moist.  Eyes: Pupils are equal, round, and reactive to light. Conjunctivae are normal. Right eye exhibits no discharge. Left eye exhibits no discharge.  Neck: Normal range of motion. Neck supple. No JVD present. No tracheal deviation present.  Cardiovascular: Normal rate, regular rhythm and intact distal pulses.  No murmur heard. Pulmonary/Chest: Effort normal and breath sounds normal. No stridor. No respiratory distress. She has no wheezes. She has no rales.  Tender to palpation over left anterior chest wall.  No overlying rash, bruise.  No crepitus.  No palpable mass.    Abdominal: Soft. There is no tenderness.  Musculoskeletal: Normal range of motion.  No leg swelling or calf tenderness.  Neurological: She is alert and oriented to person, place, and time. Coordination normal.  Skin: Skin is warm and dry. She is not diaphoretic.  Psychiatric: She has a normal mood and affect. Her behavior is normal.  Nursing note and vitals reviewed.    ED Treatments / Results  Labs (all labs ordered are listed, but only abnormal results are displayed) Labs Reviewed  CBC - Abnormal; Notable for the following components:      Result Value   WBC 12.8 (*)    Hemoglobin 11.9 (*)    All other components within normal limits  BASIC METABOLIC PANEL - Abnormal; Notable for the following components:   Glucose, Bld 101 (*)    All other components within normal limits  PREGNANCY, URINE    EKG EKG Interpretation  Date/Time:  Friday  August 01 2018 19:09:45 EDT Ventricular Rate:  79 PR Interval:    QRS Duration: 89 QT Interval:  354 QTC Calculation: 406 R Axis:   77 Text Interpretation:  Sinus rhythm nl intervals no prior to compare with Confirmed by Meridee Score [phone]) on 08/01/2018 7:18:14 PM   Radiology Dg Chest 2 View  Result Date: 08/01/2018 CLINICAL DATA:  Left-sided chest pain EXAM: CHEST - 2 VIEW COMPARISON:  None. FINDINGS: The heart size and mediastinal contours are within normal limits. Both lungs are clear. The visualized skeletal structures are unremarkable. IMPRESSION: Clear lungs. Electronically Signed   By: Deatra Robinson M.D.   On: 08/01/2018 20:10    Procedures Procedures (including critical care time)  Medications Ordered in ED Medications  ketorolac (TORADOL) 30 MG/ML injection 30 mg (30 mg Intravenous Given 08/01/18 2019)     Initial Impression / Assessment and Plan / ED Course  I have reviewed the triage vital signs and the nursing notes.  Pertinent labs & imaging results that were available during my care of the patient were reviewed by me and considered in my medical decision making (see chart for details).     Present with atypical chest pain.  EKG unremarkable, no arrhythmia or ischemic changes.  No concern for ACS given patient's age, risk factors and her description of symptoms.  Chest x-ray negative for acute abnormality, no pneumonia, pneumothorax or pneumomediastinum.  Patient is PERC negative, do not suspect PE.  CBC with mild leukocytosis, patient has a history of this in the past and I do not think it is related to her current symptoms.  Otherwise lab work unremarkable.  Patient treated with Toradol and she reports improvement in her headache and chest pain. Her symptoms may be musculoskeletal given worsened with palpation and lifting the left arm.  She also may have a component of gastritis given she reports belching.  Discussed NSAID use at home for pain.  Plan to discharge  home with instructions to follow-up with her PCP.  Discussed return precautions and she agrees and appears reliable.  Final Clinical Impressions(s) / ED Diagnoses   Final diagnoses:  Nonspecific chest pain  Acute nonintractable headache, unspecified headache type    ED Discharge Orders    None       Lawrence Marseilles 08/02/18 0038    Terrilee Files, MD 08/02/18 1009

## 2018-08-01 NOTE — ED Triage Notes (Signed)
C/o CP started this am-NAD-steady gait 

## 2018-08-01 NOTE — ED Notes (Signed)
Patient transported to X-ray 

## 2018-08-01 NOTE — ED Notes (Signed)
Pt c/o generalized chest pain since this morning with a left side headache, pain is worse with movement and belching

## 2018-08-01 NOTE — Discharge Instructions (Addendum)
Your blood work, EKG and chest x-ray were reassuring.  You can take Tylenol and ibuprofen at home as needed for pain.  Follow-up with your regular doctor for routine checkup.  Return to the ER if you have any new or concerning symptoms like trouble breathing, feeling as if you are going to pass out, or your chest pain worsens or becomes concerning in any way.

## 2019-06-22 ENCOUNTER — Encounter: Payer: Self-pay | Admitting: Physician Assistant

## 2019-06-22 ENCOUNTER — Ambulatory Visit: Payer: Self-pay

## 2019-06-22 ENCOUNTER — Telehealth: Payer: Self-pay | Admitting: Physician Assistant

## 2019-06-22 DIAGNOSIS — N898 Other specified noninflammatory disorders of vagina: Secondary | ICD-10-CM

## 2019-06-22 DIAGNOSIS — N390 Urinary tract infection, site not specified: Secondary | ICD-10-CM

## 2019-06-22 MED ORDER — NITROFURANTOIN MONOHYD MACRO 100 MG PO CAPS: 100.0000 mg | ORAL_CAPSULE | Freq: Two times a day (BID) | ORAL | 0 refills | Status: DC
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
## 2019-07-24 ENCOUNTER — Other Ambulatory Visit (HOSPITAL_COMMUNITY)
Admission: RE | Admit: 2019-07-24 | Discharge: 2019-07-24 | Disposition: A | Discharge status: Home / Self Care | Payer: 59 | Source: Ambulatory Visit | Attending: Family Medicine | Admitting: Family Medicine

## 2019-07-24 ENCOUNTER — Other Ambulatory Visit: Payer: Self-pay

## 2019-07-24 ENCOUNTER — Ambulatory Visit (INDEPENDENT_AMBULATORY_CARE_PROVIDER_SITE_OTHER): Payer: 59 | Admitting: Family Medicine

## 2019-07-24 ENCOUNTER — Encounter: Payer: Self-pay | Admitting: Family Medicine

## 2019-07-24 VITALS — BP 129/65 | HR 75 | Ht 62.0 in | Wt 244.0 lb

## 2019-07-24 DIAGNOSIS — Z01419 Encounter for gynecological examination (general) (routine) without abnormal findings: Secondary | ICD-10-CM | POA: Insufficient documentation

## 2019-07-24 DIAGNOSIS — N898 Other specified noninflammatory disorders of vagina: Secondary | ICD-10-CM

## 2019-07-24 DIAGNOSIS — N946 Dysmenorrhea, unspecified: Secondary | ICD-10-CM

## 2019-07-24 DIAGNOSIS — Z6841 Body Mass Index (BMI) 40.0 and over, adult: Secondary | ICD-10-CM

## 2019-07-24 MED ORDER — LEVONORGEST-ETH ESTRAD 91-DAY 0.15-0.03 MG PO TABS: 1.0000 | ORAL_TABLET | Freq: Every day | ORAL | 4 refills | Status: DC

## 2019-07-24 NOTE — Progress Notes (Signed)
GYNECOLOGY ANNUAL PREVENTATIVE CARE ENCOUNTER NOTE  Subjective:   Karen IshiharaLena Mcgee is a 28 y.o. [id number] female here for a routine annual gynecologic exam.  Current complaints: painful periods. Not able to lose weight. Having vaginal odor..   Denies abnormal vaginal bleeding, discharge, pelvic pain, problems with intercourse or other gynecologic concerns.    Gynecologic History No LMP recorded. Patient is sexually active  Contraception: tubal ligation Last Pap: 2017. Results were: normal Last mammogram: n/a  Obstetric History OB History  Gravida Para Term Preterm AB Living  3 3 3     3   SAB TAB Ectopic Multiple Live Births        0 3    # Outcome Date GA Lbr Len/2nd Weight Sex Delivery Anes PTL Lv  3 Term 09/22/16 [id number] 11:06 / 00:09 8 lb 5.2 oz (3.776 kg) M Vag-Spont EPI  LIV  2 Term 08/25/10   6 lb 4 oz (2.835 kg) M Vag-Spont  N LIV  1 Term 04/30/09   7 lb 1 oz (3.204 kg) F Vag-Spont  N LIV    Past Medical History:  Diagnosis Date  . GERD (gastroesophageal reflux disease)    with pregnancy  . Medical history non-contributory     Past Surgical History:  Procedure Laterality Date  . LAPAROSCOPIC TUBAL LIGATION Bilateral 11/27/2016   Procedure: LAPAROSCOPIC TUBAL LIGATION;  Surgeon: Willodean Rosenthalarolyn Harraway-Smith, MD;  Location: [facility];  Service: Gynecology;  Laterality: Bilateral;  . pilonidal cystectomy      Current Outpatient Medications on File Prior to Visit  Medication Sig Dispense Refill  . Prenatal MV-Min-FA-Omega-3 (PRENATAL GUMMIES/DHA & FA) 0.4-32.5 MG CHEW Chew 3 each by mouth daily.    . nitrofurantoin, macrocrystal-monohydrate, (MACROBID) 100 MG capsule Take 1 capsule (100 mg total) by mouth 2 (two) times daily. 10 capsule 0   No current facility-administered medications on file prior to visit.     No Known Allergies  Social History   Socioeconomic History  . Marital status: Divorced    Spouse name: Not on file  . Number of children: Not on file   . Years of education: Not on file  . Highest education level: Not on file  Occupational History  . Not on file  Social Needs  . Financial resource strain: Not on file  . Food insecurity    Worry: Not on file    Inability: Not on file  . Transportation needs    Medical: Not on file    Non-medical: Not on file  Tobacco Use  . Smoking status: Never Smoker  . Smokeless tobacco: Never Used  Substance and Sexual Activity  . Alcohol use: No  . Drug use: No  . Sexual activity: Not on file  Lifestyle  . Physical activity    Days per week: Not on file    Minutes per session: Not on file  . Stress: Not on file  Relationships  . Social Musicianconnections    Talks on phone: Not on file    Gets together: Not on file    Attends religious service: Not on file    Active member of club or organization: Not on file    Attends meetings of clubs or organizations: Not on file    Relationship status: Not on file  . Intimate partner violence    Fear of current or ex partner: Not on file    Emotionally abused: Not on file    Physically abused: Not on file    Forced  sexual activity: Not on file  Other Topics Concern  . Not on file  Social History Narrative  . Not on file    History reviewed. No pertinent family history.  The following portions of the patient's history were reviewed and updated as appropriate: allergies, current medications, past family history, past medical history, past social history, past surgical history and problem list.  Review of Systems Pertinent items are noted in HPI.   Objective:  BP 129/65   Pulse 75   Ht 5\' 2"  (1.575 m)   Wt 244 lb (110.7 kg)   BMI 44.63 kg/m  Wt Readings from Last 3 Encounters:  07/24/19 244 lb (110.7 kg)  08/01/18 241 lb (109.3 kg)  12/14/16 200 lb (90.7 kg)     CONSTITUTIONAL: Well-developed, well-nourished female in no acute distress.  HENT:  Normocephalic, atraumatic, External right and left ear normal. Oropharynx is clear and moist  EYES: Conjunctivae and EOM are normal. Pupils are equal, round, and reactive to light. No scleral icterus.  NECK: Normal range of motion, supple, no masses.  Normal thyroid.   CARDIOVASCULAR: Normal heart rate noted, regular rhythm RESPIRATORY: Clear to auscultation bilaterally. Effort and breath sounds normal, no problems with respiration noted. BREASTS: Symmetric in size. No masses, skin changes, nipple drainage, or lymphadenopathy. ABDOMEN: Soft, normal bowel sounds, no distention noted.  No tenderness, rebound or guarding.  PELVIC: Normal appearing external genitalia; normal appearing vaginal mucosa and cervix.  No abnormal discharge noted.  Normal uterine size, no other palpable masses, no uterine or adnexal tenderness. MUSCULOSKELETAL: Normal range of motion. No tenderness.  No cyanosis, clubbing, or edema.  2+ distal pulses. SKIN: Skin is warm and dry. No rash noted. Not diaphoretic. No erythema. No pallor. NEUROLOGIC: Alert and oriented to person, place, and time. Normal reflexes, muscle tone coordination. No cranial nerve deficit noted. PSYCHIATRIC: Normal mood and affect. Normal behavior. Normal judgment and thought content.  Assessment:  Annual gynecologic examination with pap smear   Plan:  1. Well Woman Exam Will follow up results of pap smear and manage accordingly. Mammogram scheduled STD testing discussed. Patient requested testing - Cytology - PAP( [place]) - Cervicovaginal ancillary only( [place])  2. Class 3 severe obesity with body mass index (BMI) of 40.0 to 44.9 in adult, unspecified obesity type, unspecified whether serious comorbidity present [facility]) Will refer to Dr Adair Patter with obesity medicine - Ambulatory referral to [facility]  3. Vaginal odor  - Cervicovaginal ancillary only( [facility])  4. Dysmenorrhea Will try seasonique    Routine preventative health maintenance measures emphasized. Please refer to After Visit Summary for other  counseling recommendations.    [place], [place] for [employer]

## 2019-07-27 LAB — CERVICOVAGINAL ANCILLARY ONLY
Bacterial vaginitis: POSITIVE — AB
Candida vaginitis: NEGATIVE

## 2019-07-28 MED ORDER — METRONIDAZOLE 500 MG PO TABS: 500.0000 mg | ORAL_TABLET | Freq: Two times a day (BID) | ORAL | 0 refills | Status: DC

## 2019-07-28 NOTE — Addendum Note (Signed)
Addended by: Truett Mainland on: 07/28/2019 10:13 AM   Modules accepted: Orders

## 2019-07-30 LAB — CYTOLOGY - PAP
Chlamydia: NEGATIVE
Diagnosis: UNDETERMINED — AB
HPV: DETECTED — AB
Neisseria Gonorrhea: NEGATIVE

## 2019-07-31 ENCOUNTER — Telehealth: Payer: Self-pay

## 2019-07-31 NOTE — Telephone Encounter (Signed)
Patient called made aware that she has an abnormal pap

## 2019-08-20 ENCOUNTER — Other Ambulatory Visit: Payer: Self-pay

## 2019-08-20 ENCOUNTER — Other Ambulatory Visit (HOSPITAL_COMMUNITY)
Admission: RE | Admit: 2019-08-20 | Discharge: 2019-08-20 | Disposition: A | Discharge status: Home / Self Care | Payer: 59 | Source: Ambulatory Visit | Attending: Family Medicine | Admitting: Family Medicine

## 2019-08-20 ENCOUNTER — Encounter: Payer: Self-pay | Admitting: Family Medicine

## 2019-08-20 ENCOUNTER — Ambulatory Visit (INDEPENDENT_AMBULATORY_CARE_PROVIDER_SITE_OTHER): Payer: 59 | Admitting: Family Medicine

## 2019-08-20 VITALS — BP 116/54 | HR 66 | Ht 62.0 in | Wt 244.1 lb

## 2019-08-20 DIAGNOSIS — R87620 Atypical squamous cells of undetermined significance on cytologic smear of vagina (ASC-US): Secondary | ICD-10-CM | POA: Diagnosis not present

## 2019-08-20 DIAGNOSIS — Z3202 Encounter for pregnancy test, result negative: Secondary | ICD-10-CM

## 2019-08-20 LAB — POCT URINE PREGNANCY: Preg Test, Ur: NEGATIVE

## 2019-08-20 NOTE — Progress Notes (Signed)
Patient Name: Karen Mcgee, female   DOB: 21-Jul-1991, 28 y.o.  MRN: [id number]  Colposcopy Procedure Note:  [id number] Pregnancy status: Unknown Indications: ASCUS, +HR HPV HPV:  Positive Cervical History:  Previous Abnormal Pap: none  Previous Colposcopy: none  Previous LEEP or Cryo: none  Smoking: Never Smoked Hysterectomy: No   Patient given informed consent, signed copy in the chart, time out was performed.    Exam: Vulva and Vagina grossly normal.  Cervix viewed with speculum and colposcope after application of acetic acid:  Cervix Fully Visualized Squamocolumnar Junction Visibility: Fully visualized  Acetowhite lesions: none o'clock  Other Lesions: None Punctation: Not present  Mosaicism: Not present Abnormal vasculature: No   Biopsies: none  ECC: Yes  Hemostasis achieved with:  n/a  Colposcopy Impression:  Benign   Patient was given post procedure instructions.  Will call patient with results.

## 2019-08-24 LAB — SURGICAL PATHOLOGY

## 2019-09-17 ENCOUNTER — Telehealth: Payer: Self-pay | Admitting: Family Medicine

## 2019-09-17 NOTE — Telephone Encounter (Signed)
Called patient - she would like to switch off seasonale due to her face breaking out. Discussed options with her - different OCP vs different method. She would like to try the IUD. Will get her scheduled for insertion.

## 2019-09-25 ENCOUNTER — Encounter: Payer: Self-pay | Admitting: Family Medicine

## 2019-09-25 ENCOUNTER — Other Ambulatory Visit: Payer: Self-pay

## 2019-09-25 ENCOUNTER — Ambulatory Visit (INDEPENDENT_AMBULATORY_CARE_PROVIDER_SITE_OTHER): Payer: 59 | Admitting: Family Medicine

## 2019-09-25 VITALS — BP 118/60 | HR 72 | Wt 237.0 lb

## 2019-09-25 DIAGNOSIS — N939 Abnormal uterine and vaginal bleeding, unspecified: Secondary | ICD-10-CM

## 2019-09-25 DIAGNOSIS — Z30430 Encounter for insertion of intrauterine contraceptive device: Secondary | ICD-10-CM

## 2019-09-25 MED ORDER — LEVONORGESTREL 19.5 MCG/DAY IU IUD
INTRAUTERINE_SYSTEM | Freq: Once | INTRAUTERINE | Status: AC
Start: 2019-09-25 — End: 2019-09-25
  Administered 2019-09-25: 09:00:00 via INTRAUTERINE

## 2019-09-25 NOTE — Progress Notes (Signed)
IUD for bleeding. Kathrene Alu RN

## 2019-09-25 NOTE — Progress Notes (Signed)
IUD Procedure Note Patient identified, informed consent performed, signed copy in chart, time out was performed.  Urine pregnancy test negative.  Speculum placed in the vagina.  Cervix visualized.  Cleaned with Betadine x 2.  Grasped anteriorly with a single tooth tenaculum.  Uterus sounded to 9 cm.  Liletta  IUD placed per manufacturer's recommendations.  Strings trimmed to 3 cm. Tenaculum was removed, good hemostasis noted.  Patient tolerated procedure well.   Patient given post procedure instructions and Liletta care card with expiration date.  Patient is asked to check IUD strings periodically and follow up in 4-6 weeks for IUD check.  

## 2019-09-28 ENCOUNTER — Telehealth: Payer: Self-pay
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
## 2019-10-19 ENCOUNTER — Ambulatory Visit (INDEPENDENT_AMBULATORY_CARE_PROVIDER_SITE_OTHER): Payer: 59 | Admitting: Family Medicine

## 2019-10-19 ENCOUNTER — Other Ambulatory Visit: Payer: Self-pay

## 2019-10-19 ENCOUNTER — Encounter: Payer: Self-pay | Admitting: Family Medicine

## 2019-10-19 VITALS — BP 116/71 | HR 73 | Ht 62.0 in | Wt 239.0 lb

## 2019-10-19 DIAGNOSIS — Z30431 Encounter for routine checking of intrauterine contraceptive device: Secondary | ICD-10-CM

## 2019-10-19 NOTE — Progress Notes (Signed)
   Subjective:   Patient Name: Karen Mcgee, female   DOB: 1990/12/19, 28 y.o.  MRN: [id number]  HPI Patient here for an IUD check.  She had the [place] IUD placed 1 month ago. IUD placed for menses control She reports no problems.   Review of Systems  Constitutional: Negative for fever and chills.  Gastrointestinal: Negative for abdominal pain.  Genitourinary: Negative for vaginal discharge, vaginal pain, pelvic pain and dyspareunia.        Objective:   Physical Exam  Constitutional: She appears well-developed and well-nourished.  HENT:  Head: Normocephalic and atraumatic.  Abdominal: Soft. There is no tenderness. There is no guarding.  Genitourinary: There is no rash, tenderness or lesion on the right labia. There is no rash, tenderness or lesion on the left labia. No erythema or tenderness in the vagina. No foreign body around the vagina. No signs of injury around the vagina. No vaginal discharge found.    Skin: Skin is warm and dry.  Psychiatric: She has a normal mood and affect. Her behavior is normal. Judgment and thought content normal.       Assessment & Plan:  1. IUD check up IUD in place.  Pt to call with any other problems.  Recheck in 1 year.

## 2020-03-21 ENCOUNTER — Telehealth: Payer: Self-pay

## 2020-03-21 NOTE — Telephone Encounter (Signed)
Patient had Tdap at [facility] - documented under historical immunizations. Armandina Stammer RN

## 2020-07-28 ENCOUNTER — Encounter: Payer: Self-pay | Admitting: Family Medicine

## 2020-07-28 ENCOUNTER — Ambulatory Visit (INDEPENDENT_AMBULATORY_CARE_PROVIDER_SITE_OTHER): Payer: No Typology Code available for payment source | Admitting: Family Medicine

## 2020-07-28 ENCOUNTER — Other Ambulatory Visit (HOSPITAL_COMMUNITY)
Admission: RE | Admit: 2020-07-28 | Discharge: 2020-07-28 | Disposition: A | Discharge status: Home / Self Care | Payer: No Typology Code available for payment source | Source: Ambulatory Visit | Attending: Family Medicine | Admitting: Family Medicine

## 2020-07-28 ENCOUNTER — Other Ambulatory Visit: Payer: Self-pay

## 2020-07-28 VITALS — BP 102/80 | HR 88 | Ht 63.0 in | Wt 256.0 lb

## 2020-07-28 DIAGNOSIS — R102 Pelvic and perineal pain: Secondary | ICD-10-CM | POA: Diagnosis not present

## 2020-07-28 DIAGNOSIS — Z30431 Encounter for routine checking of intrauterine contraceptive device: Secondary | ICD-10-CM | POA: Diagnosis not present

## 2020-07-28 DIAGNOSIS — Z113 Encounter for screening for infections with a predominantly sexual mode of transmission: Secondary | ICD-10-CM

## 2020-07-28 NOTE — Progress Notes (Signed)
   Subjective:    Patient ID: Karen Mcgee, female    DOB: 12/14/1990, 29 y.o.   MRN: [id number]  HPI Patient presents with intermittent pelvic pain that started 4 weeks ago. Occurred 4 times, lasting about 10 minutes each time. Pelvic pain is mid-pelvic. No abnormal discharge or bleeding. No new sexual partners.   Review of Systems     Objective:   Physical Exam Vitals reviewed. Exam conducted with a chaperone present.  Cardiovascular:     Rate and Rhythm: Normal rate and regular rhythm.     Pulses: Normal pulses.  Abdominal:     Hernia: There is no hernia in the left inguinal area or right inguinal area.  Genitourinary:    Labia:        Right: No rash, tenderness or lesion.        Left: No rash, tenderness or lesion.      Vagina: No signs of injury and foreign body. No vaginal discharge, erythema, tenderness or bleeding.     Cervix: No cervical motion tenderness, discharge, friability, lesion, erythema or cervical bleeding.     Lymphadenopathy:     Lower Body: No right inguinal adenopathy. No left inguinal adenopathy.        Assessment & Plan:  1. Pelvic pain Check [place] and vaginal cultures - US PELVIS TRANSVAGINAL NON-OB (TV ONLY); Future - Cervicovaginal ancillary only( [place])  2. Screening for STD (sexually transmitted disease)  - Cervicovaginal ancillary only( [place])

## 2020-07-28 NOTE — Progress Notes (Signed)
Patient having pelvic pain and cramping- it started about one month. Armandina Stammer RN

## 2020-08-01 LAB — CERVICOVAGINAL ANCILLARY ONLY
Chlamydia: NEGATIVE
Comment: NEGATIVE
Comment: NEGATIVE
Comment: NORMAL
Neisseria Gonorrhea: NEGATIVE
Trichomonas: NEGATIVE

## 2020-08-02 ENCOUNTER — Telehealth: Payer: Self-pay

## 2020-08-02 NOTE — Telephone Encounter (Signed)
Pt called stating her abdominal pain is a 10. Pt is scheduled for a [place] on 08/05/20. Advised pt to take ibuprofen and try a heating pad to relieve the pain. Pt states she can't take ibuprofen at work because it makes her drowsy. Pt states she will try to change her [place] appt. Romain Erion l Osha Rane, CMA

## 2020-08-03 ENCOUNTER — Other Ambulatory Visit: Payer: Self-pay

## 2020-08-03 ENCOUNTER — Ambulatory Visit (INDEPENDENT_AMBULATORY_CARE_PROVIDER_SITE_OTHER): Payer: No Typology Code available for payment source

## 2020-08-03 DIAGNOSIS — R102 Pelvic and perineal pain unspecified side: Secondary | ICD-10-CM

## 2020-08-05 ENCOUNTER — Other Ambulatory Visit (HOSPITAL_BASED_OUTPATIENT_CLINIC_OR_DEPARTMENT_OTHER): Payer: No Typology Code available for payment source

## 2020-10-10 ENCOUNTER — Other Ambulatory Visit: Payer: Self-pay | Admitting: Family Medicine

## 2020-11-03 ENCOUNTER — Ambulatory Visit: Payer: No Typology Code available for payment source | Admitting: Family Medicine

## 2020-12-09 ENCOUNTER — Ambulatory Visit (INDEPENDENT_AMBULATORY_CARE_PROVIDER_SITE_OTHER): Payer: No Typology Code available for payment source | Admitting: Family Medicine

## 2020-12-09 ENCOUNTER — Other Ambulatory Visit: Payer: Self-pay

## 2020-12-09 VITALS — BP 103/68 | HR 65 | Wt 249.0 lb

## 2020-12-09 DIAGNOSIS — N939 Abnormal uterine and vaginal bleeding, unspecified: Secondary | ICD-10-CM | POA: Diagnosis not present

## 2020-12-09 NOTE — Progress Notes (Signed)
   Subjective:    Patient ID: Karen Mcgee, female    DOB: May 18, 1991, 30 y.o.   MRN: [id number]  HPI 29yo G3P3 with an IUD for menorrhagia (also has BTL - filshie and electrodessication). She has been having light menses since placement. Presents with menses in December, then started again two weeks later. Currently having heavy bleeding with clots.  Patient had COVID vaccine in October, then had COVID about 2 weeks after vaccine.  Review of Systems     Objective:   Physical Exam Vitals reviewed. Exam conducted with a chaperone present.  Abdominal:     Hernia: There is no hernia in the left inguinal area or right inguinal area.  Genitourinary:    Labia:        Right: No rash, tenderness, lesion or injury.        Left: No rash, tenderness, lesion or injury.      Urethra: No prolapse or urethral swelling.     Vagina: Normal. No signs of injury and foreign body. No vaginal discharge or erythema.     Lymphadenopathy:     Lower Body: No right inguinal adenopathy. No left inguinal adenopathy.         Assessment & Plan:  1. Abnormal uterine bleeding (AUB) COVID and COVID vaccine has been shown to cause increased menorrhagia in some women, although this would be a delay in effects. Track menses and return in 3 months to see if pattern develops.

## 2020-12-09 NOTE — Progress Notes (Signed)
Had period 2 weeks ago. Now having heavy period with clots. Armandina Stammer RN

## 2021-03-05 ENCOUNTER — Emergency Department (HOSPITAL_BASED_OUTPATIENT_CLINIC_OR_DEPARTMENT_OTHER)
Admission: EM | Admit: 2021-03-05 | Discharge: 2021-03-05 | Disposition: A | Discharge status: Home / Self Care | Payer: No Typology Code available for payment source | Attending: Emergency Medicine | Admitting: Emergency Medicine

## 2021-03-05 ENCOUNTER — Emergency Department (HOSPITAL_BASED_OUTPATIENT_CLINIC_OR_DEPARTMENT_OTHER): Payer: No Typology Code available for payment source

## 2021-03-05 ENCOUNTER — Other Ambulatory Visit: Payer: Self-pay

## 2021-03-05 DIAGNOSIS — S00501A Unspecified superficial injury of lip, initial encounter: Secondary | ICD-10-CM | POA: Diagnosis present

## 2021-03-05 DIAGNOSIS — R220 Localized swelling, mass and lump, head: Secondary | ICD-10-CM

## 2021-03-05 DIAGNOSIS — L539 Erythematous condition, unspecified: Secondary | ICD-10-CM | POA: Diagnosis not present

## 2021-03-05 DIAGNOSIS — R600 Localized edema: Secondary | ICD-10-CM | POA: Diagnosis not present

## 2021-03-05 DIAGNOSIS — S01511A Laceration without foreign body of lip, initial encounter: Secondary | ICD-10-CM
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
## 2021-03-05 NOTE — ED Triage Notes (Signed)
Pt presents to ED POV. Pt c/o mouth pain. Pt reports that she bit her cheek last night and now there is pain and swelling. R cheek visibly swollen. Airway intact.

## 2021-03-09 ENCOUNTER — Encounter: Payer: Self-pay | Admitting: Family Medicine

## 2021-03-09 ENCOUNTER — Ambulatory Visit (INDEPENDENT_AMBULATORY_CARE_PROVIDER_SITE_OTHER): Payer: No Typology Code available for payment source | Admitting: Family Medicine

## 2021-03-09 ENCOUNTER — Other Ambulatory Visit: Payer: Self-pay

## 2021-03-09 VITALS — BP 114/61 | HR 81 | Wt 253.0 lb

## 2021-03-09 DIAGNOSIS — N926 Irregular menstruation, unspecified: Secondary | ICD-10-CM

## 2021-03-09 DIAGNOSIS — N939 Abnormal uterine and vaginal bleeding, unspecified: Secondary | ICD-10-CM

## 2021-03-09 LAB — POCT URINE PREGNANCY: Preg Test, Ur: NEGATIVE

## 2021-03-09 NOTE — Progress Notes (Signed)
   Subjective:    Patient ID: Karen Mcgee, female    DOB: 1990-12-30, 30 y.o.   MRN: [id number]  HPI Following up from AUB with IUD. Normal cycles since January without any heavy bleeding. Is 2 weeks late for period that was supposed to start on 3/21.   Review of Systems     Objective:   Physical Exam Constitutional:      Appearance: Normal appearance.  Cardiovascular:     Rate and Rhythm: Normal rate and regular rhythm.  Pulmonary:     Effort: Pulmonary effort is normal.     Breath sounds: Normal breath sounds.  Skin:    General: Skin is warm and dry.     Capillary Refill: Capillary refill takes less than 2 seconds.  Neurological:     General: No focal deficit present.     Mental Status: She is alert.  Psychiatric:        Mood and Affect: Mood normal.        Behavior: Behavior normal.        Thought Content: Thought content normal.        Judgment: Judgment normal.       Assessment & Plan:  1. Abnormal uterine bleeding (AUB) Resolved.  2. Missed period UPT neg

## 2021-07-05 ENCOUNTER — Ambulatory Visit: Payer: No Typology Code available for payment source | Admitting: Obstetrics & Gynecology

## 2022-03-07 ENCOUNTER — Encounter: Payer: Self-pay | Admitting: Family Medicine

## 2022-03-07 ENCOUNTER — Ambulatory Visit (INDEPENDENT_AMBULATORY_CARE_PROVIDER_SITE_OTHER): Payer: No Typology Code available for payment source | Admitting: Family Medicine

## 2022-03-07 ENCOUNTER — Other Ambulatory Visit (HOSPITAL_COMMUNITY)
Admission: RE | Admit: 2022-03-07 | Discharge: 2022-03-07 | Disposition: A | Discharge status: Home / Self Care | Payer: No Typology Code available for payment source | Source: Ambulatory Visit | Attending: Family Medicine | Admitting: Family Medicine

## 2022-03-07 VITALS — BP 135/62 | HR 77 | Ht 65.0 in | Wt 261.0 lb

## 2022-03-07 DIAGNOSIS — Z01419 Encounter for gynecological examination (general) (routine) without abnormal findings: Secondary | ICD-10-CM

## 2022-03-07 DIAGNOSIS — N939 Abnormal uterine and vaginal bleeding, unspecified: Secondary | ICD-10-CM | POA: Diagnosis not present

## 2022-03-07 DIAGNOSIS — A749 Chlamydial infection, unspecified: Secondary | ICD-10-CM

## 2022-03-07 DIAGNOSIS — Z9851 Tubal ligation status: Secondary | ICD-10-CM | POA: Diagnosis not present

## 2022-03-07 HISTORY — DX: Chlamydial infection, unspecified: A74.9

## 2022-03-07 NOTE — Progress Notes (Signed)
? ? ?GYNECOLOGY ANNUAL PREVENTATIVE CARE ENCOUNTER NOTE ? ?Subjective:  ? Karen Mcgee is a 31 y.o. G49P3003 female here for a routine annual gynecologic exam.  Current complaints: Patient desires tubal reversal. She had BTL with Filshie clips. She does have an IUD in place to help control AUB.   Denies abnormal vaginal bleeding, discharge, pelvic pain, problems with intercourse or other gynecologic concerns.  ?  ?Gynecologic History ?Patient's last menstrual period was 02/20/2022 (approximate). ?Patient is sexually active  ?Contraception: IUD and tubal ligation ?Last Pap: 8/21. Results were: Ascus +HPV. ?Last mammogram: N/a. ? ?The pregnancy intention screening data noted above was reviewed. Potential methods of contraception were discussed. The patient elected to proceed with No data recorded. ? ? ?Obstetric History ?OB History  ?Gravida Para Term Preterm AB Living  ?3 3 3     3   ?SAB IAB Ectopic Multiple Live Births  ?      0 3  ?  ?# Outcome Date GA Lbr Len/2nd Weight Sex Delivery Anes PTL Lv  ?3 Term 09/22/16 [redacted]w[redacted]d 11:06 / 00:09 8 lb 5.2 oz (3.776 kg) M Vag-Spont EPI  LIV  ?2 Term 08/25/10   6 lb 4 oz (2.835 kg) M Vag-Spont  N LIV  ?1 Term 04/30/09   7 lb 1 oz (3.204 kg) F Vag-Spont  N LIV  ? ? ?Past Medical History:  ?Diagnosis Date  ? GERD (gastroesophageal reflux disease)   ? with pregnancy  ? Medical history non-contributory   ? ? ?Past Surgical History:  ?Procedure Laterality Date  ? LAPAROSCOPIC TUBAL LIGATION Bilateral 11/27/2016  ? Procedure: LAPAROSCOPIC TUBAL LIGATION;  Surgeon: 11/29/2016, MD;  Location: [facility];  Service: Gynecology;  Laterality: Bilateral;  ? pilonidal cystectomy    ? ? ?Current Outpatient Medications on File Prior to Visit  ?Medication Sig Dispense Refill  ? levonorgestrel-ethinyl estradiol (SEASONALE) 0.15-0.03 MG tablet Take 1 tablet by mouth daily. (Patient not taking: Reported on 07/28/2020) 1 Package 4  ? metroNIDAZOLE (FLAGYL) 500 MG tablet Take 1 tablet  (500 mg total) by mouth 2 (two) times daily. (Patient not taking: Reported on 08/20/2019) 14 tablet 0  ? nitrofurantoin, macrocrystal-monohydrate, (MACROBID) 100 MG capsule Take 1 capsule (100 mg total) by mouth 2 (two) times daily. (Patient not taking: Reported on 08/20/2019) 10 capsule 0  ? Prenatal MV-Min-FA-Omega-3 (PRENATAL GUMMIES/DHA & FA) 0.4-32.5 MG CHEW Chew 3 each by mouth daily. (Patient not taking: Reported on 07/28/2020)    ? ?No current facility-administered medications on file prior to visit.  ? ? ?No Known Allergies ? ?Social History  ? ?Socioeconomic History  ? Marital status: Divorced  ?  Spouse name: Not on file  ? Number of children: Not on file  ? Years of education: Not on file  ? Highest education level: Not on file  ?Occupational History  ? Not on file  ?Tobacco Use  ? Smoking status: Never  ? Smokeless tobacco: Never  ?Substance and Sexual Activity  ? Alcohol use: No  ? Drug use: No  ? Sexual activity: Yes  ?  Birth control/protection: I.U.D., Surgical  ?Other Topics Concern  ? Not on file  ?Social History Narrative  ? Not on file  ? ?Social Determinants of Health  ? ?Financial Resource Strain: Not on file  ?Food Insecurity: Not on file  ?Transportation Needs: Not on file  ?Physical Activity: Not on file  ?Stress: Not on file  ?Social Connections: Not on file  ?Intimate Partner Violence: Not on file  ? ? ?  No family history on file. ? ?The following portions of the patient's history were reviewed and updated as appropriate: allergies, current medications, past family history, past medical history, past social history, past surgical history and problem list. ? ?Review of Systems ?Pertinent items are noted in HPI. ?  ?Objective:  ?BP 135/62   Pulse 77   Ht 5\' 5"  (1.651 m)   Wt 261 lb (118.4 kg)   LMP 02/20/2022 (Approximate)   Breastfeeding No   BMI 43.43 kg/m?  ?Wt Readings from Last 3 Encounters:  ?03/07/22 261 lb (118.4 kg)  ?03/09/21 253 lb (114.8 kg)  ?12/09/20 249 lb (112.9 kg)  ?   ? ?Chaperone present during exam ? ?CONSTITUTIONAL: Well-developed, well-nourished female in no acute distress.  ?HENT:  Normocephalic, atraumatic, External right and left ear normal. Oropharynx is clear and moist ?EYES: Conjunctivae and EOM are normal. Pupils are equal, round, and reactive to light. No scleral icterus.  ?NECK: Normal range of motion, supple, no masses.  Normal thyroid.  ? ?CARDIOVASCULAR: Normal heart rate noted, regular rhythm ?RESPIRATORY: Clear to auscultation bilaterally. Effort and breath sounds normal, no problems with respiration noted. ?BREASTS: Symmetric in size. No masses, skin changes, nipple drainage, or lymphadenopathy. ?ABDOMEN: Soft, normal bowel sounds, no distention noted.  No tenderness, rebound or guarding.  ?PELVIC: Normal appearing external genitalia; normal appearing vaginal mucosa and cervix.  No abnormal discharge noted.  Normal uterine size, no other palpable masses, no uterine or adnexal tenderness. IUD strings seen ?MUSCULOSKELETAL: Normal range of motion. No tenderness.  No cyanosis, clubbing, or edema.  2+ distal pulses. ?SKIN: Skin is warm and dry. No rash noted. Not diaphoretic. No erythema. No pallor. ?NEUROLOGIC: Alert and oriented to person, place, and time. Normal reflexes, muscle tone coordination. No cranial nerve deficit noted. ?PSYCHIATRIC: Normal mood and affect. Normal behavior. Normal judgment and thought content. ? ?Assessment:  ?Annual gynecologic examination with pap smear ?  ?Plan:  ?1. Well Woman Exam ?Will follow up results of pap smear and manage accordingly. ? ?2. Abnormal uterine bleeding (AUB) ?IUD in place. Recommended keeping IUD in place until after tubal reversal.  ? ?3. History of tubal ligation ?Desires to become pregnant.Will refer to [facility] for tubal reversal. ? ? ?Routine preventative health maintenance measures emphasized. ?Please refer to After Visit Summary for other counseling recommendations.  ? ? ?02/06/21, DO ?Center for [facility] ? ?

## 2022-03-07 NOTE — Progress Notes (Signed)
Patient states she went on vacation and had her period but then 2 weeks later she had a weird jelly like clot she passed. ?Patient would like to try and conceive so she would like her IUD removed today and talk about tubal reversal. Armandina Stammer RN  ?

## 2022-03-13 ENCOUNTER — Telehealth: Payer: Self-pay

## 2022-03-13 DIAGNOSIS — A749 Chlamydial infection, unspecified: Secondary | ICD-10-CM

## 2022-03-13 LAB — CYTOLOGY - PAP
Adequacy: ABSENT
Chlamydia: POSITIVE — AB
Comment: NEGATIVE
Comment: NEGATIVE
Comment: NORMAL
Diagnosis: NEGATIVE
Diagnosis: REACTIVE
High risk HPV: NEGATIVE
Neisseria Gonorrhea: NEGATIVE

## 2022-03-13 MED ORDER — DOXYCYCLINE HYCLATE 100 MG PO CAPS: 100.0000 mg | ORAL_CAPSULE | Freq: Two times a day (BID) | ORAL | 0 refills | Status: DC
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
## 2023-04-12 ENCOUNTER — Ambulatory Visit: Payer: No Typology Code available for payment source | Admitting: Family Medicine

## 2023-04-12 ENCOUNTER — Encounter: Payer: Self-pay | Admitting: Family Medicine

## 2023-04-12 VITALS — BP 130/64 | HR 69 | Ht 65.0 in | Wt 261.0 lb

## 2023-04-12 DIAGNOSIS — N644 Mastodynia: Secondary | ICD-10-CM | POA: Diagnosis not present

## 2023-04-12 HISTORY — DX: Mastodynia: N64.4

## 2023-04-12 NOTE — Progress Notes (Signed)
   Subjective:    Patient ID: Karen Mcgee is a 32 y.o. female presenting with No chief complaint on file.  on 04/12/2023  HPI: Reports 1 week history of bilateral nipple pain. Feels like they are on fire. No change in appearance. No change in soaps or bras.right seems worse. Improved with holding them. No FH of breast issues. No nipple discharge. No masses noted. No other changes to skin or other issues noted.  Review of Systems  Constitutional:  Negative for chills and fever.  Respiratory:  Negative for shortness of breath.   Cardiovascular:  Negative for chest pain.  Gastrointestinal:  Negative for abdominal pain, nausea and vomiting.  Genitourinary:  Negative for dysuria.  Skin:  Negative for rash.      Objective:    BP 130/64   Pulse 69   Ht 5\' 5"  (1.651 m)   Wt 261 lb (118.4 kg)   LMP 03/23/2023 (Within Days)   BMI 43.43 kg/m  Physical Exam Exam conducted with a chaperone present.  Constitutional:      General: She is not in acute distress.    Appearance: She is well-developed.  HENT:     Head: Normocephalic and atraumatic.  Eyes:     General: No scleral icterus. Cardiovascular:     Rate and Rhythm: Normal rate.  Pulmonary:     Effort: Pulmonary effort is normal.  Chest:  Breasts:    Right: Tenderness present. No swelling, bleeding, inverted nipple, mass, nipple discharge or skin change.     Left: Tenderness present. No swelling, bleeding, inverted nipple, mass, nipple discharge or skin change.  Abdominal:     Palpations: Abdomen is soft.  Musculoskeletal:     Cervical back: Neck supple.  Skin:    General: Skin is warm and dry.  Neurological:     Mental Status: She is alert and oriented to person, place, and time.         Assessment & Plan:   Problem List Items Addressed This Visit       Unprioritized   Nipple pain - Primary    Unclear etiology, no findings on exam of infection, discharge, mass. Check imaging. Discussed possible Raynaud's,  to check for color change, etc. Supportive bra, Evening primrose oil, decrease nipple stimulation.      Relevant Orders   MM 3D SCREENING MAMMOGRAM BILATERAL BREAST     Return if symptoms worsen or fail to improve.  Reva Bores, MD 04/12/2023 8:33 AM

## 2023-04-12 NOTE — Patient Instructions (Signed)
Evening Primrose Oil Good supportive bra

## 2023-04-12 NOTE — Assessment & Plan Note (Signed)
Unclear etiology, no findings on exam of infection, discharge, mass. Check imaging. Discussed possible Raynaud's, to check for color change, etc. Supportive bra, Evening primrose oil, decrease nipple stimulation.

## 2023-04-12 NOTE — Progress Notes (Signed)
Patient has had breast tenderness and painful nipples. Armandina Stammer RN

## 2023-04-16 ENCOUNTER — Inpatient Hospital Stay (HOSPITAL_BASED_OUTPATIENT_CLINIC_OR_DEPARTMENT_OTHER): Admission: RE | Admit: 2023-04-16 | Payer: No Typology Code available for payment source | Source: Ambulatory Visit

## 2023-08-02 ENCOUNTER — Ambulatory Visit (INDEPENDENT_AMBULATORY_CARE_PROVIDER_SITE_OTHER): Payer: No Typology Code available for payment source | Admitting: Obstetrics & Gynecology

## 2023-08-02 ENCOUNTER — Encounter: Payer: Self-pay | Admitting: Obstetrics & Gynecology

## 2023-08-02 ENCOUNTER — Other Ambulatory Visit (HOSPITAL_COMMUNITY)
Admission: RE | Admit: 2023-08-02 | Discharge: 2023-08-02 | Disposition: A | Discharge status: Home / Self Care | Payer: No Typology Code available for payment source | Source: Ambulatory Visit | Attending: Obstetrics & Gynecology | Admitting: Obstetrics & Gynecology

## 2023-08-02 VITALS — BP 118/64 | HR 79 | Wt 258.0 lb

## 2023-08-02 DIAGNOSIS — Z113 Encounter for screening for infections with a predominantly sexual mode of transmission: Secondary | ICD-10-CM | POA: Diagnosis present

## 2023-08-02 DIAGNOSIS — Z01419 Encounter for gynecological examination (general) (routine) without abnormal findings: Secondary | ICD-10-CM | POA: Insufficient documentation

## 2023-08-02 NOTE — Progress Notes (Signed)
GYNECOLOGY ANNUAL PREVENTATIVE CARE ENCOUNTER NOTE  History:     Karen Mcgee is a 32 y.o. G25P3003 female here for a routine annual gynecologic exam.  Current complaints: none.  Has Liletta in place for the last four years, satisfied with method.   Denies abnormal vaginal bleeding, discharge, pelvic pain, problems with intercourse or other gynecologic concerns.    Gynecologic History No LMP recorded. (Menstrual status: IUD). Contraception: IUD Last Pap: 03/07/2022. Result was normal with negative HPV  Obstetric History OB History  Gravida Para Term Preterm AB Living  3 3 3     3   SAB IAB Ectopic Multiple Live Births        0 3    # Outcome Date GA Lbr Len/2nd Weight Sex Type Anes PTL Lv  3 Term 09/22/16 [redacted]w[redacted]d 11:06 / 00:09 8 lb 5.2 oz (3.776 kg) M Vag-Spont EPI  LIV  2 Term 08/25/10   6 lb 4 oz (2.835 kg) M Vag-Spont  N LIV  1 Term 04/30/09   7 lb 1 oz (3.204 kg) F Vag-Spont  N LIV    Past Medical History:  Diagnosis Date   Chlamydia 03/07/2022   GERD (gastroesophageal reflux disease)    with pregnancy   Nipple pain 04/12/2023    Past Surgical History:  Procedure Laterality Date   LAPAROSCOPIC TUBAL LIGATION Bilateral 11/27/2016   Procedure: LAPAROSCOPIC TUBAL LIGATION;  Surgeon: Willodean Rosenthal, MD;  Location: [facility];  Service: Gynecology;  Laterality: Bilateral;   pilonidal cystectomy      No current outpatient medications on file prior to visit.   No current facility-administered medications on file prior to visit.    No Known Allergies  Social History:  reports that she has never smoked. She has never used smokeless tobacco. She reports that she does not drink alcohol and does not use drugs.  History reviewed. No pertinent family history.  The following portions of the patient's history were reviewed and updated as appropriate: allergies, current medications, past family history, past medical history, past social history, past surgical history  and problem list.  Review of Systems Pertinent items noted in HPI and remainder of comprehensive ROS otherwise negative.  Physical Exam:  BP 118/64   Pulse 79   Wt 258 lb (117 kg)   BMI 42.93 kg/m  CONSTITUTIONAL: Well-developed, well-nourished female in no acute distress.  HENT:  Normocephalic, atraumatic, External right and left ear normal.  EYES: Conjunctivae and EOM are normal. Pupils are equal, round, and reactive to light. No scleral icterus.  NECK: Normal range of motion, supple, no masses.  Normal thyroid.  SKIN: Skin is warm and dry. No rash noted. Not diaphoretic. No erythema. No pallor. MUSCULOSKELETAL: Normal range of motion. No tenderness.  No cyanosis, clubbing, or edema. NEUROLOGIC: Alert and oriented to person, place, and time. Normal reflexes, muscle tone coordination.  PSYCHIATRIC: Normal mood and affect. Normal behavior. Normal judgment and thought content. CARDIOVASCULAR: Normal heart rate noted, regular rhythm RESPIRATORY: Clear to auscultation bilaterally. Effort and breath sounds normal, no problems with respiration noted. BREASTS: Symmetric in size. No masses, tenderness, skin changes, nipple drainage, or lymphadenopathy bilaterally. Performed in the presence of a chaperone. ABDOMEN: Soft, no distention noted.  No tenderness, rebound or guarding.  PELVIC: Normal appearing external genitalia and urethral meatus; normal appearing vaginal mucosa and cervix.  No abnormal vaginal discharge noted.  Liletta strings seen. Pap smear obtained.  Normal uterine size, no other palpable masses, no uterine or adnexal tenderness.  Performed in the presence of a chaperone.   Assessment and Plan:    1. Routine screening for STI (sexually transmitted infection) Desired STI screen, will follow up results and manage accordingly. - Cytology - PAP( [facility]) - RPR+HBsAg+HCVAb+...  2. Well woman exam with routine gynecological exam - Cytology - PAP( [place]) Will follow up  results of pap smear and manage accordingly. Normal breast examination today, she was advised to perform periodic self breast examinations.  Routine preventative health maintenance measures emphasized. Please refer to After Visit Summary for other counseling recommendations.      Jaynie Collins, MD, FACOG Obstetrician & Gynecologist, [facility] [employer], [facility]

## 2023-08-08 LAB — CYTOLOGY - PAP
Chlamydia: NEGATIVE
Comment: NEGATIVE
Comment: NEGATIVE
Comment: NEGATIVE
Comment: NORMAL
Diagnosis: NEGATIVE
High risk HPV: NEGATIVE
Neisseria Gonorrhea: NEGATIVE
Trichomonas: NEGATIVE

## 2024-05-14 DIAGNOSIS — Z419 Encounter for procedure for purposes other than remedying health state, unspecified: Secondary | ICD-10-CM | POA: Diagnosis not present

## 2024-06-13 DIAGNOSIS — Z419 Encounter for procedure for purposes other than remedying health state, unspecified: Secondary | ICD-10-CM | POA: Diagnosis not present

## 2024-07-14 DIAGNOSIS — Z419 Encounter for procedure for purposes other than remedying health state, unspecified: Secondary | ICD-10-CM | POA: Diagnosis not present

## 2024-08-14 DIAGNOSIS — Z419 Encounter for procedure for purposes other than remedying health state, unspecified: Secondary | ICD-10-CM | POA: Diagnosis not present

## 2024-09-24 ENCOUNTER — Ambulatory Visit

## 2024-09-24 ENCOUNTER — Other Ambulatory Visit (HOSPITAL_COMMUNITY)
Admission: RE | Admit: 2024-09-24 | Discharge: 2024-09-24 | Disposition: A | Source: Ambulatory Visit | Attending: Family Medicine | Admitting: Family Medicine

## 2024-09-24 VITALS — BP 131/67 | HR 87 | Ht 62.0 in | Wt 248.1 lb

## 2024-09-24 DIAGNOSIS — Z202 Contact with and (suspected) exposure to infections with a predominantly sexual mode of transmission: Secondary | ICD-10-CM | POA: Diagnosis present

## 2024-09-24 NOTE — Progress Notes (Signed)
 SUBJECTIVE:  33 y.o. female who desires a STI screen. Pt states that she has pelvic pain. No UTI symptoms. Is unsure of known exposure to STD.  Patient's last menstrual period was 09/10/2024 (exact date).  OBJECTIVE:  She appears well.   ASSESSMENT:  STI Screen   PLAN:  Pt offered STI blood screening-requested GC, chlamydia, and trichomonas probe sent to lab.  Treatment: To be determined once lab results are received.  Pt follow up as needed.   Shawnee Fleet, CMA

## 2024-09-25 LAB — RPR+HBSAG+HCVAB+...
HIV Screen 4th Generation wRfx: NONREACTIVE
Hep C Virus Ab: NONREACTIVE
Hepatitis B Surface Ag: NEGATIVE
RPR Ser Ql: NONREACTIVE

## 2024-09-28 LAB — CERVICOVAGINAL ANCILLARY ONLY
Bacterial Vaginitis (gardnerella): POSITIVE — AB
Candida Glabrata: NEGATIVE
Candida Vaginitis: NEGATIVE
Chlamydia: NEGATIVE
Comment: NEGATIVE
Comment: NEGATIVE
Comment: NEGATIVE
Comment: NEGATIVE
Comment: NEGATIVE
Comment: NORMAL
Neisseria Gonorrhea: NEGATIVE
Trichomonas: NEGATIVE

## 2024-09-29 ENCOUNTER — Ambulatory Visit: Payer: Self-pay | Admitting: Family Medicine

## 2024-09-29 MED ORDER — METRONIDAZOLE 500 MG PO TABS: 500.0000 mg | ORAL_TABLET | Freq: Two times a day (BID) | ORAL | 0 refills | Status: AC

## 2024-10-14 DIAGNOSIS — Z419 Encounter for procedure for purposes other than remedying health state, unspecified: Secondary | ICD-10-CM | POA: Diagnosis not present

## 2024-12-06 ENCOUNTER — Emergency Department (HOSPITAL_BASED_OUTPATIENT_CLINIC_OR_DEPARTMENT_OTHER)

## 2024-12-06 ENCOUNTER — Other Ambulatory Visit: Payer: Self-pay

## 2024-12-06 ENCOUNTER — Emergency Department (HOSPITAL_BASED_OUTPATIENT_CLINIC_OR_DEPARTMENT_OTHER)
Admission: EM | Admit: 2024-12-06 | Discharge: 2024-12-06 | Disposition: A | Discharge status: Home / Self Care | Source: Home / Self Care | Attending: Emergency Medicine | Admitting: Emergency Medicine

## 2024-12-06 ENCOUNTER — Encounter (HOSPITAL_BASED_OUTPATIENT_CLINIC_OR_DEPARTMENT_OTHER): Payer: Self-pay

## 2024-12-06 DIAGNOSIS — J111 Influenza due to unidentified influenza virus with other respiratory manifestations: Secondary | ICD-10-CM | POA: Insufficient documentation

## 2024-12-06 DIAGNOSIS — R509 Fever, unspecified: Secondary | ICD-10-CM | POA: Diagnosis present

## 2024-12-06 MED ORDER — IBUPROFEN 800 MG PO TABS
800.0000 mg | ORAL_TABLET | Freq: Once | ORAL | Status: AC
  Administered 2024-12-06: 800 mg via ORAL
  Filled 2024-12-06: qty 1

## 2024-12-06 MED ORDER — ONDANSETRON HCL 4 MG PO TABS: 4.0000 mg | ORAL_TABLET | Freq: Four times a day (QID) | ORAL | 0 refills | Status: AC

## 2024-12-06 MED ORDER — ACETAMINOPHEN 500 MG PO TABS
1000.0000 mg | ORAL_TABLET | Freq: Once | ORAL | Status: AC
  Administered 2024-12-06: 1000 mg via ORAL
  Filled 2024-12-06: qty 2

## 2024-12-06 NOTE — Discharge Instructions (Addendum)
 You likely have the flu.  Your chest x-ray did not show any pneumonia.  I have sent you some Zofran  to take if you develop some nausea in the coming days.  You can take 1000 mg of Tylenol  every 8 hours, for 100 mg of ibuprofen  every 6 hours.  Follow-up with your PCP.

## 2024-12-06 NOTE — ED Provider Notes (Signed)
 " [place] [facility] Provider Note   CSN: [id number] Arrival date & time: 12/06/24  1352     Patient presents with: Fever   Karen Mcgee is a 34 y.o. female.   Otherwise healthy 34 year old female here today for cough shortness of breath, ear fullness and myalgias which began today.  She works at [employer], all of the other employees have tested positive for influenza, and she was the last 1 to not have the symptoms.   Fever      Prior to Admission medications  Medication Sig Start Date End Date Taking? Authorizing Provider  ondansetron  (ZOFRAN ) 4 MG tablet Take 1 tablet (4 mg total) by mouth every 6 (six) hours. 12/06/24  Yes Mannie Pac T, DO  metroNIDAZOLE  (FLAGYL ) 500 MG tablet Take 1 tablet (500 mg total) by mouth 2 (two) times daily. 09/29/24   Stinson, Jacob J, DO    Allergies: Patient has no known allergies.    Review of Systems  Constitutional:  Positive for fever.    Updated Vital Signs BP (!) 152/117 (BP Location: Right Arm)   Pulse 99   Temp 98 F (36.7 C) (Oral)   Resp 20   Ht 5' 4 (1.626 m)   Wt 81.6 kg   SpO2 100%   BMI 30.90 kg/m   Physical Exam Vitals and nursing note reviewed.  Constitutional:      Appearance: She is not toxic-appearing.  HENT:     Mouth/Throat:     Mouth: Mucous membranes are moist.  Cardiovascular:     Rate and Rhythm: Normal rate.  Pulmonary:     Effort: Pulmonary effort is normal.  Abdominal:     General: Abdomen is flat.  Musculoskeletal:        General: Normal range of motion.  Skin:    General: Skin is warm.  Neurological:     General: No focal deficit present.     Mental Status: She is alert.     (all labs ordered are listed, but only abnormal results are displayed) Labs Reviewed - No data to display  EKG: None  Radiology: DG Chest 2 View Result Date: 12/06/2024 EXAM: 2 VIEW(S) XRAY OF THE CHEST 12/06/2024 02:14:08 PM COMPARISON: 8 / 30 / 19 CLINICAL  HISTORY: cough, sob FINDINGS: LUNGS AND PLEURA: No focal pulmonary opacity. No pleural effusion. No pneumothorax. HEART AND MEDIASTINUM: No acute abnormality of the cardiac and mediastinal silhouettes. BONES AND SOFT TISSUES: No acute osseous abnormality. IMPRESSION: 1. No acute cardiopulmonary process. Electronically signed by: Norman Gatlin MD 12/06/2024 02:23 PM EST RP Workstation: [id number]     Procedures   Medications Ordered in the ED  ibuprofen  (ADVIL ) tablet 800 mg (800 mg Oral Given 12/06/24 1611)  acetaminophen  (TYLENOL ) tablet 1,000 mg (1,000 mg Oral Given 12/06/24 1611)                                    Medical Decision Making 34 year old female here today with URI symptoms.  Plan-patient likely with influenza.  Due to limited supplies we cannot viral swab this patient.  Her chest x-ray per my independent review shows no pneumonia.  Provided the patient with some Tylenol  and ibuprofen .  Will send her with some Zofran  in the event she develops some nausea in the coming days.  Patient agreeable with plan.  Amount and/or Complexity of Data Reviewed Radiology: ordered.  Risk OTC drugs.  Prescription drug management.        Final diagnoses:  Influenza    ED Discharge Orders          Ordered    ondansetron  (ZOFRAN ) 4 MG tablet  Every 6 hours        12/06/24 1613               Mannie Pac T, DO 12/06/24 1614  "

## 2024-12-06 NOTE — ED Triage Notes (Signed)
 Pt reports cough, sob, fever and L ear pain x4 days.

## 2024-12-22 ENCOUNTER — Institutional Professional Consult (permissible substitution): Admitting: Plastic Surgery

## 2024-12-30 ENCOUNTER — Telehealth: Payer: Self-pay | Admitting: Plastic Surgery

## 2024-12-30 NOTE — Telephone Encounter (Signed)
 Called and lvmail to move consult Dr Waddell out of office at this time

## 2025-01-06 ENCOUNTER — Institutional Professional Consult (permissible substitution): Admitting: Plastic Surgery
